# Patient Record
Sex: Female | Born: 1975 | Race: Black or African American | Hispanic: No | Marital: Married | Smoking: Current every day smoker
Health system: Southern US, Community
[De-identification: ages and names within clinical notes are randomized; demographics above are authoritative.]

## PROBLEM LIST (undated history)

## (undated) DIAGNOSIS — N8000 Endometriosis of the uterus, unspecified: Secondary | ICD-10-CM

## (undated) DIAGNOSIS — G609 Hereditary and idiopathic neuropathy, unspecified: Secondary | ICD-10-CM

## (undated) DIAGNOSIS — F32A Depression, unspecified: Secondary | ICD-10-CM

## (undated) DIAGNOSIS — R519 Headache, unspecified: Secondary | ICD-10-CM

## (undated) DIAGNOSIS — F419 Anxiety disorder, unspecified: Secondary | ICD-10-CM

## (undated) DIAGNOSIS — H5789 Other specified disorders of eye and adnexa: Secondary | ICD-10-CM

## (undated) DIAGNOSIS — M1990 Unspecified osteoarthritis, unspecified site: Secondary | ICD-10-CM

## (undated) DIAGNOSIS — D573 Sickle-cell trait: Secondary | ICD-10-CM

## (undated) DIAGNOSIS — R51 Headache: Secondary | ICD-10-CM

## (undated) DIAGNOSIS — F329 Major depressive disorder, single episode, unspecified: Secondary | ICD-10-CM

## (undated) DIAGNOSIS — M792 Neuralgia and neuritis, unspecified: Secondary | ICD-10-CM

## (undated) DIAGNOSIS — D649 Anemia, unspecified: Secondary | ICD-10-CM

## (undated) DIAGNOSIS — I10 Essential (primary) hypertension: Secondary | ICD-10-CM

## (undated) HISTORY — DX: Headache: R51

## (undated) HISTORY — DX: Other specified disorders of eye and adnexa: H57.89

## (undated) HISTORY — DX: Anxiety disorder, unspecified: F41.9

## (undated) HISTORY — DX: Neuralgia and neuritis, unspecified: M79.2

## (undated) HISTORY — DX: Endometriosis of the uterus, unspecified: N80.00

## (undated) HISTORY — DX: Sickle-cell trait: D57.3

## (undated) HISTORY — DX: Depression, unspecified: F32.A

## (undated) HISTORY — DX: Endometriosis of uterus: N80.0

## (undated) HISTORY — DX: Morbid (severe) obesity due to excess calories: E66.01

## (undated) HISTORY — DX: Headache, unspecified: R51.9

## (undated) HISTORY — DX: Unspecified osteoarthritis, unspecified site: M19.90

## (undated) HISTORY — DX: Essential (primary) hypertension: I10

## (undated) HISTORY — DX: Anemia, unspecified: D64.9

## (undated) HISTORY — PX: OTHER SURGICAL HISTORY: SHX169

---
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
## 2014-05-10 ENCOUNTER — Other Ambulatory Visit: Payer: Self-pay | Admitting: Family Medicine

## 2014-05-10 DIAGNOSIS — Z975 Presence of (intrauterine) contraceptive device: Secondary | ICD-10-CM

## 2014-05-11 ENCOUNTER — Other Ambulatory Visit: Payer: Self-pay

## 2014-05-12 ENCOUNTER — Ambulatory Visit
Admission: RE | Admit: 2014-05-12 | Discharge: 2014-05-12 | Disposition: A | Discharge status: Home / Self Care | Payer: BC Managed Care – PPO | Source: Ambulatory Visit | Attending: Family Medicine | Admitting: Family Medicine

## 2014-05-12 ENCOUNTER — Encounter (INDEPENDENT_AMBULATORY_CARE_PROVIDER_SITE_OTHER): Payer: Self-pay

## 2014-05-12 DIAGNOSIS — Z975 Presence of (intrauterine) contraceptive device: Secondary | ICD-10-CM

## 2014-05-19 ENCOUNTER — Encounter (INDEPENDENT_AMBULATORY_CARE_PROVIDER_SITE_OTHER): Payer: BC Managed Care – PPO | Admitting: Ophthalmology

## 2014-05-19 DIAGNOSIS — H43819 Vitreous degeneration, unspecified eye: Secondary | ICD-10-CM

## 2014-05-19 DIAGNOSIS — H35349 Macular cyst, hole, or pseudohole, unspecified eye: Secondary | ICD-10-CM

## 2014-05-19 DIAGNOSIS — I10 Essential (primary) hypertension: Secondary | ICD-10-CM

## 2014-05-19 DIAGNOSIS — H2510 Age-related nuclear cataract, unspecified eye: Secondary | ICD-10-CM

## 2014-05-19 DIAGNOSIS — H35039 Hypertensive retinopathy, unspecified eye: Secondary | ICD-10-CM

## 2014-06-07 ENCOUNTER — Encounter: Payer: Self-pay | Admitting: Obstetrics & Gynecology

## 2014-06-07 ENCOUNTER — Ambulatory Visit (INDEPENDENT_AMBULATORY_CARE_PROVIDER_SITE_OTHER): Payer: BC Managed Care – PPO | Admitting: Obstetrics & Gynecology

## 2014-06-07 ENCOUNTER — Ambulatory Visit: Payer: Self-pay | Admitting: Obstetrics & Gynecology

## 2014-06-07 VITALS — BP 108/65 | HR 65 | Temp 98.2°F | Ht 63.5 in | Wt 230.0 lb

## 2014-06-07 DIAGNOSIS — Z3202 Encounter for pregnancy test, result negative: Secondary | ICD-10-CM

## 2014-06-07 DIAGNOSIS — T8332XA Displacement of intrauterine contraceptive device, initial encounter: Secondary | ICD-10-CM | POA: Insufficient documentation

## 2014-06-07 DIAGNOSIS — R102 Pelvic and perineal pain: Secondary | ICD-10-CM

## 2014-06-07 DIAGNOSIS — R109 Unspecified abdominal pain: Secondary | ICD-10-CM

## 2014-06-07 DIAGNOSIS — G8929 Other chronic pain: Secondary | ICD-10-CM

## 2014-06-07 DIAGNOSIS — N949 Unspecified condition associated with female genital organs and menstrual cycle: Secondary | ICD-10-CM

## 2014-06-07 MED ORDER — MEFENAMIC ACID 250 MG PO CAPS: 250.0000 | ORAL_CAPSULE | Freq: Three times a day (TID) | ORAL | Status: DC

## 2014-06-07 NOTE — Progress Notes (Signed)
Patient ID: Sheri Roberts, female   DOB: 06-12-1976, 38 y.o.   MRN: [id number]  Chief Complaint  Patient presents with  . Problem    Worsening cramping    HPI Sheri Roberts is a 38 y.o. female.  H/O dysmenorrhea and AUB--?A.  Mirena placed several years ago for medical management.  She reports a good response to the heavy menses however there has been a gradual worsening the pelvic cramping.  She does not take any OTC analgesics.  Recent 2-D US imaging showed normal adnexa and the IUD in an appropriate position. There were also sonographic features c/w possible adenomyosis. HPI  Past Medical History  Diagnosis Date  . Cyst of eye   . Hypertension     History reviewed. No pertinent past surgical history.  Family History  Problem Relation Age of Onset  . Stroke Mother   . Diabetes Other   . Glaucoma Other   . Hypertension Other     Social History History  Substance Use Topics  . Smoking status: Former Research scientist (life sciences)  . Smokeless tobacco: Never Used  . Alcohol Use: No    Allergies  Allergen Reactions  . Bee Venom Swelling  . Penicillins Hives  . Sulfa Antibiotics Hives    Current Outpatient Prescriptions  Medication Sig Dispense Refill  . amitriptyline (ELAVIL) 25 MG tablet Take 25 mg by mouth at bedtime.      . bisoprolol-hydrochlorothiazide (ZIAC) 5-6.25 MG per tablet Take 1 tablet by mouth daily.      . DULoxetine (CYMBALTA) 30 MG capsule Take 30 mg by mouth daily.      Marland Kitchen EPINEPHrine (EPIPEN) 0.3 mg/0.3 mL IJ SOAJ injection Inject into the muscle once.      . Eszopiclone 3 MG TABS Take 3 mg by mouth at bedtime. Take immediately before bedtime      . fluticasone (FLONASE) 50 MCG/ACT nasal spray Place into both nostrils daily.      . furosemide (LASIX) 20 MG tablet Take 20 mg by mouth.      . gabapentin (NEURONTIN) 300 MG capsule Take 300 mg by mouth 3 (three) times daily.      Marland Kitchen imipramine (TOFRANIL) 50 MG tablet Take 50 mg by mouth at bedtime.      . fluconazole  (DIFLUCAN) 150 MG tablet Take 150 mg by mouth daily.      Marland Kitchen FLUoxetine (PROZAC) 10 MG capsule Take 10 mg by mouth daily.      . Mefenamic Acid 250 MG CAPS Take 250 capsules (62,500 mg total) by mouth 3 (three) times daily.  21 each  2   No current facility-administered medications for this visit.    Review of Systems Review of Systems Constitutional: negative for fatigue and weight loss Respiratory: negative for cough and wheezing Cardiovascular: negative for chest pain, fatigue and palpitations Gastrointestinal: negative for abdominal pain and change in bowel habits Genitourinary: positive for pelvic cramping, brown discharge Integument/breast: negative for nipple discharge Musculoskeletal:negative for myalgias Neurological: negative for gait problems and tremors Behavioral/Psych: negative for abusive relationship, depression Endocrine: negative for temperature intolerance     Blood pressure 108/65, pulse 65, temperature 98.2 F (36.8 C), height 5' 3.5" (1.613 m), weight 104.327 kg (230 lb).  Physical Exam Physical Exam General:   alert  Skin:   no rash or abnormalities  Lungs:   clear to auscultation bilaterally  Heart:   regular rate and rhythm, S1, S2 normal, no murmur, click, rub or gallop  Breasts:   normal without suspicious  masses, skin or nipple changes or axillary nodes  Abdomen:  normal findings: no organomegaly, soft, non-tender and no hernia  Pelvis:  External genitalia: normal general appearance Urinary system: urethral meatus normal and bladder without fullness, nontender Vaginal: normal without tenderness, induration or masses Cervix: normal appearance Adnexa: normal bimanual exam Uterus: anteverted and non-tender, normal size    3-D U/S imaging of the uterus was performed--no myometrial penetration of the IUD  Data Reviewed U/S, pelvic UPT Assessment    Pelvic pain--likely adenomyosis; doubt malpositioned IUD    Plan     Trial of Ponstel Need to  obtain previous records Possible management options include: removal of IUD-->GnRH agonist, aromatase inhibitor, continuous COCP, endometrial ablation-?opitmal candidate secondary to obesity, [place], hysterectomy Follow up as needed.        Roberts,Sheri Laughman A 06/07/2014, 5:57 PM

## 2014-06-08 LAB — POCT URINE PREGNANCY: PREG TEST UR: NEGATIVE

## 2014-06-08 NOTE — Addendum Note (Signed)
Addended by: Ladona Ridgel on: 06/08/2014 11:02 AM   Modules accepted: Orders

## 2014-06-24 ENCOUNTER — Telehealth: Payer: Self-pay | Admitting: *Deleted

## 2014-06-24 NOTE — Telephone Encounter (Signed)
Pt placed call to office asking for return call. Return call made to pt.  Pt states that she is concerned about IUD and what other birth control to use.  Pt states that she has history of blood clots and has high BP.  Pt states that this makes her concerned about taking pills.  Pt made aware that her history and medication would be reviewed before any medication was given to know what would be best for her.  Pt states that she knows that she doesn't want any more children and mentioned all other options that were discussed at last visit- patch, pill, hysterctomy.  Pt would like to know what is best plan for her. Pt advised her concerns would be sent to Dr Delsa Sale for further review and ask for her advisement.  Pt made aware that she would be contacted once recommendations have been made.  Pt states understanding.  Can you please advise of best plan for pt regarding birth control?

## 2014-06-29 NOTE — Telephone Encounter (Signed)
IUD is safe with her history.  If she has any other concerns offer an appointment to discuss.

## 2014-06-30 NOTE — Telephone Encounter (Signed)
Left message on pt cell number to contact the office regarding her message from last week.

## 2014-06-30 NOTE — Telephone Encounter (Signed)
Pt needs to be advised of Dr Delsa Sale recommendation

## 2014-07-02 NOTE — Telephone Encounter (Signed)
Pt states that she currently has IUD in place.  Pt was seen at the end on June for increase pelvic cramping.  Pt states that the cramping is no better, she will get sharp pains in her pelvic area.  Pt states that she was given pain med at last visit, Mefenamic Acid, pt is currently trying to get refill.  Pt made aware that this will be sent to Delsa Sale for further review.  Pt made aware that she may need f/u appt for pelvic pain.   Please advise.

## 2014-07-04 NOTE — Telephone Encounter (Signed)
Offer appointment; consider possible removal of IUD

## 2014-07-06 NOTE — Telephone Encounter (Signed)
Attempt to call pt. No answer, LM on VM to contact office.

## 2014-07-21 NOTE — Telephone Encounter (Signed)
LM on VM to CB- next choice would be to remove the IUD. Please call if her symptoms are no better and that is the option she would like .

## 2014-08-04 DIAGNOSIS — I10 Essential (primary) hypertension: Secondary | ICD-10-CM | POA: Insufficient documentation

## 2014-08-04 DIAGNOSIS — D573 Sickle-cell trait: Secondary | ICD-10-CM | POA: Insufficient documentation

## 2014-08-04 DIAGNOSIS — N8003 Adenomyosis of the uterus: Secondary | ICD-10-CM | POA: Insufficient documentation

## 2014-08-04 DIAGNOSIS — N800 Endometriosis of uterus: Secondary | ICD-10-CM | POA: Insufficient documentation

## 2014-08-04 DIAGNOSIS — N809 Endometriosis, unspecified: Secondary | ICD-10-CM

## 2014-08-10 DIAGNOSIS — M792 Neuralgia and neuritis, unspecified: Secondary | ICD-10-CM | POA: Insufficient documentation

## 2014-09-21 DIAGNOSIS — J309 Allergic rhinitis, unspecified: Secondary | ICD-10-CM | POA: Insufficient documentation

## 2014-10-11 ENCOUNTER — Encounter: Payer: Self-pay | Admitting: Obstetrics & Gynecology

## 2014-10-21 ENCOUNTER — Ambulatory Visit: Payer: BC Managed Care – PPO | Admitting: Obstetrics & Gynecology

## 2014-10-26 ENCOUNTER — Ambulatory Visit: Payer: BC Managed Care – PPO | Admitting: Obstetrics

## 2014-11-24 ENCOUNTER — Encounter: Payer: Self-pay | Admitting: Obstetrics & Gynecology

## 2014-11-24 ENCOUNTER — Ambulatory Visit (INDEPENDENT_AMBULATORY_CARE_PROVIDER_SITE_OTHER): Payer: BC Managed Care – PPO | Admitting: Obstetrics & Gynecology

## 2014-11-24 VITALS — BP 104/68 | HR 64 | Temp 98.1°F | Ht 63.0 in | Wt 228.0 lb

## 2014-11-24 DIAGNOSIS — G8929 Other chronic pain: Secondary | ICD-10-CM

## 2014-11-24 DIAGNOSIS — R102 Pelvic and perineal pain: Principal | ICD-10-CM

## 2014-11-24 DIAGNOSIS — N949 Unspecified condition associated with female genital organs and menstrual cycle: Secondary | ICD-10-CM

## 2014-11-28 NOTE — Progress Notes (Signed)
Patient ID: Sheri Roberts, female   DOB: 1976/05/10, 38 y.o.   MRN: [id number]  Chief Complaint  Patient presents with  . Follow-up    lower abdominal pain     HPI Sheri Roberts is a 38 y.o. female.  She characterizes the pain as mild. Amenorrhea.  HPI  Past Medical History  Diagnosis Date  . Cyst of eye   . Hypertension     History reviewed. No pertinent past surgical history.  Family History  Problem Relation Age of Onset  . Stroke Mother   . Diabetes Other   . Glaucoma Other   . Hypertension Other     Social History History  Substance Use Topics  . Smoking status: Former Research scientist (life sciences)  . Smokeless tobacco: Never Used  . Alcohol Use: No    Allergies  Allergen Reactions  . Bee Venom Swelling  . Penicillins Hives  . Sulfa Antibiotics Hives    Current Outpatient Prescriptions  Medication Sig Dispense Refill  . Armodafinil (NUVIGIL) 250 MG tablet Take 250 mg by mouth daily.    . bisoprolol-hydrochlorothiazide (ZIAC) 5-6.25 MG per tablet Take 1 tablet by mouth daily.    . Cholecalciferol (VITAMIN D PO) Take by mouth.    . EPINEPHrine (EPIPEN) 0.3 mg/0.3 mL IJ SOAJ injection Inject into the muscle once.    . fluticasone (FLONASE) 50 MCG/ACT nasal spray Place into both nostrils daily.    . furosemide (LASIX) 20 MG tablet Take 20 mg by mouth.    . gabapentin (NEURONTIN) 300 MG capsule Take 300 mg by mouth 3 (three) times daily.    Marland Kitchen Gentamicin-Prednisolone Acet (PRED-G OP) Apply to eye.    . hydrOXYzine (ATARAX/VISTARIL) 25 MG tablet Take 25 mg by mouth 3 (three) times daily as needed.    . IRON PO Take by mouth.    . MELOXICAM PO Take 10 mg by mouth.    . OMEPRAZOLE PO Take 20 mg by mouth.    Marland Kitchen POTASSIUM PO Take by mouth.    Marland Kitchen amitriptyline (ELAVIL) 25 MG tablet Take 25 mg by mouth at bedtime.    . DULoxetine (CYMBALTA) 30 MG capsule Take 30 mg by mouth daily.    . Eszopiclone 3 MG TABS Take 3 mg by mouth at bedtime. Take immediately before bedtime    .  fluconazole (DIFLUCAN) 150 MG tablet Take 150 mg by mouth daily.    Marland Kitchen FLUoxetine (PROZAC) 10 MG capsule Take 10 mg by mouth daily.    Marland Kitchen HYDROCODONE-ACETAMINOPHEN PO Take by mouth.    Marland Kitchen imipramine (TOFRANIL) 50 MG tablet Take 50 mg by mouth at bedtime.    . Mefenamic Acid 250 MG CAPS Take 250 capsules (62,500 mg total) by mouth 3 (three) times daily. (Patient not taking: Reported on 11/24/2014) 21 each 2   No current facility-administered medications for this visit.    Review of Systems Review of Systems Constitutional: negative for fatigue and weight loss Respiratory: negative for cough and wheezing Cardiovascular: negative for chest pain, fatigue and palpitations Gastrointestinal: negative for abdominal pain and change in bowel habits Genitourinary:positive for pelvic pain Integument/breast: negative for nipple discharge Musculoskeletal:negative for myalgias Neurological: negative for gait problems and tremors Behavioral/Psych: negative for abusive relationship, depression Endocrine: negative for temperature intolerance     Blood pressure 104/68, pulse 64, temperature 98.1 F (36.7 C), height 5\' 3"  (1.6 m), weight 103.42 kg (228 lb), last menstrual period 10/25/2014.  Physical Exam Physical Exam   50% of 15 min visit spent on  counseling and coordination of care.   Data Reviewed None  Assessment    Possible adenomyosis--Mirena IUD insitu Counseled re: duration of Mirena use--6 - 7 yrs     Plan    Follow up as needed or in 6 mths         JACKSON-MOORE,Raymir Frommelt A 11/28/2014, 12:34 PM

## 2014-12-06 ENCOUNTER — Encounter: Payer: Self-pay | Admitting: *Deleted

## 2014-12-07 ENCOUNTER — Encounter: Payer: Self-pay | Admitting: Obstetrics & Gynecology

## 2015-03-17 ENCOUNTER — Telehealth: Payer: Self-pay

## 2015-03-17 ENCOUNTER — Other Ambulatory Visit: Payer: Self-pay | Admitting: *Deleted

## 2015-03-17 DIAGNOSIS — N939 Abnormal uterine and vaginal bleeding, unspecified: Secondary | ICD-10-CM

## 2015-03-17 NOTE — Telephone Encounter (Signed)
Called patient regarding appt for u/s for iud removal and new patient appt sch for 03/30/15 starting at 10am - spoke with patient

## 2015-03-25 ENCOUNTER — Telehealth: Payer: Self-pay | Admitting: Obstetrics

## 2015-03-26 ENCOUNTER — Emergency Department (HOSPITAL_COMMUNITY)
Admission: EM | Admit: 2015-03-26 | Discharge: 2015-03-26 | Disposition: A | Discharge status: Home / Self Care | Payer: BLUE CROSS/BLUE SHIELD | Attending: Emergency Medicine | Admitting: Emergency Medicine

## 2015-03-26 ENCOUNTER — Emergency Department (HOSPITAL_COMMUNITY): Payer: BLUE CROSS/BLUE SHIELD

## 2015-03-26 ENCOUNTER — Encounter (HOSPITAL_COMMUNITY): Payer: Self-pay | Admitting: Emergency Medicine

## 2015-03-26 DIAGNOSIS — Z7952 Long term (current) use of systemic steroids: Secondary | ICD-10-CM | POA: Diagnosis not present

## 2015-03-26 DIAGNOSIS — W1839XA Other fall on same level, initial encounter: Secondary | ICD-10-CM | POA: Diagnosis not present

## 2015-03-26 DIAGNOSIS — Z79899 Other long term (current) drug therapy: Secondary | ICD-10-CM | POA: Diagnosis not present

## 2015-03-26 DIAGNOSIS — Z880 Allergy status to penicillin: Secondary | ICD-10-CM | POA: Insufficient documentation

## 2015-03-26 DIAGNOSIS — S82899A Other fracture of unspecified lower leg, initial encounter for closed fracture: Secondary | ICD-10-CM

## 2015-03-26 DIAGNOSIS — I10 Essential (primary) hypertension: Secondary | ICD-10-CM | POA: Insufficient documentation

## 2015-03-26 DIAGNOSIS — Y92009 Unspecified place in unspecified non-institutional (private) residence as the place of occurrence of the external cause: Secondary | ICD-10-CM | POA: Diagnosis not present

## 2015-03-26 DIAGNOSIS — Y9389 Activity, other specified: Secondary | ICD-10-CM | POA: Diagnosis not present

## 2015-03-26 DIAGNOSIS — Y998 Other external cause status: Secondary | ICD-10-CM | POA: Insufficient documentation

## 2015-03-26 DIAGNOSIS — S99911A Unspecified injury of right ankle, initial encounter: Secondary | ICD-10-CM | POA: Diagnosis present

## 2015-03-26 DIAGNOSIS — Z87891 Personal history of nicotine dependence: Secondary | ICD-10-CM | POA: Diagnosis not present

## 2015-03-26 DIAGNOSIS — S82891A Other fracture of right lower leg, initial encounter for closed fracture: Secondary | ICD-10-CM

## 2015-03-26 DIAGNOSIS — Z8669 Personal history of other diseases of the nervous system and sense organs: Secondary | ICD-10-CM | POA: Diagnosis not present

## 2015-03-26 DIAGNOSIS — S82831A Other fracture of upper and lower end of right fibula, initial encounter for closed fracture: Secondary | ICD-10-CM | POA: Diagnosis not present

## 2015-03-26 HISTORY — DX: Hereditary and idiopathic neuropathy, unspecified: G60.9

## 2015-03-26 HISTORY — PX: ANKLE SURGERY: SHX546

## 2015-03-26 MED ORDER — MORPHINE SULFATE 4 MG/ML IJ SOLN
6.0000 mg | Freq: Once | INTRAMUSCULAR | Status: AC
  Administered 2015-03-26: 6 mg via INTRAVENOUS
  Filled 2015-03-26: qty 2

## 2015-03-26 MED ORDER — OXYCODONE-ACETAMINOPHEN 5-325 MG PO TABS: 1.0000 | ORAL_TABLET | Freq: Four times a day (QID) | ORAL | Status: DC | PRN

## 2015-03-26 MED ORDER — ETOMIDATE 2 MG/ML IV SOLN
0.1500 mg/kg | Freq: Once | INTRAVENOUS | Status: AC
  Administered 2015-03-26: 15.52 mg via INTRAVENOUS
  Filled 2015-03-26: qty 10

## 2015-03-26 MED ORDER — OXYCODONE-ACETAMINOPHEN 5-325 MG PO TABS
1.0000 | ORAL_TABLET | Freq: Once | ORAL | Status: AC
  Administered 2015-03-26: 1 via ORAL
  Filled 2015-03-26: qty 1

## 2015-03-26 NOTE — Sedation Documentation (Signed)
Medication dose calculated and verified for Etomidate

## 2015-03-26 NOTE — Sedation Documentation (Signed)
MD at bedside. 

## 2015-03-26 NOTE — ED Notes (Signed)
Notified xray of order placed

## 2015-03-26 NOTE — ED Notes (Signed)
EDP at bedside  

## 2015-03-26 NOTE — ED Provider Notes (Signed)
CSN: [id number]     Arrival date & time 03/26/15  0820 History   First MD Initiated Contact with Patient 03/26/15 [phone]     Chief Complaint  Patient presents with  . Ankle Injury    closed     (Consider location/radiation/quality/duration/timing/severity/associated sxs/prior Treatment) HPI Comments: Patient with history of idiopathic peripheral neuropathy presents with complaint of ankle injury, deformity. Patient states that she was walking in her home and her right leg gave out. She fell and twisted her ankle. EMS was called due to obvious deformity. Patient complains of pain in the ankle only. She denies knee injury or hip injury. She has a history of falls. Patient last ate last night. Patient was given 10 mg of morphine IV in route. She had palpable pulses per EMS. Placed in splint PTA. The onset of this condition was acute. The course is constant. Aggravating factors: movement. Alleviating factors: none.    Patient is a 39 y.o. female presenting with lower extremity injury. The history is provided by the patient.  Ankle Injury Associated symptoms include arthralgias and joint swelling. Pertinent negatives include no abdominal pain, chest pain, coughing, fever, headaches, myalgias, nausea, rash, sore throat or vomiting.    Past Medical History  Diagnosis Date  . Cyst of eye   . Hypertension    No past surgical history on file. Family History  Problem Relation Age of Onset  . Stroke Mother   . Diabetes Other   . Glaucoma Other   . Hypertension Other    History  Substance Use Topics  . Smoking status: Former Research scientist (life sciences)  . Smokeless tobacco: Never Used  . Alcohol Use: No   OB History    Gravida Para Term Preterm AB TAB SAB Ectopic Multiple Living   4 4 4       4      Review of Systems  Constitutional: Negative for fever.  HENT: Negative for rhinorrhea and sore throat.   Eyes: Negative for redness.  Respiratory: Negative for cough.   Cardiovascular: Negative for chest pain.   Gastrointestinal: Negative for nausea, vomiting, abdominal pain and diarrhea.  Genitourinary: Negative for dysuria.  Musculoskeletal: Positive for joint swelling and arthralgias. Negative for myalgias.  Skin: Negative for rash.  Neurological: Negative for headaches.    Allergies  Bee venom; Penicillins; and Sulfa antibiotics  Home Medications   Prior to Admission medications   Medication Sig Start Date End Date Taking? Authorizing Provider  amitriptyline (ELAVIL) 25 MG tablet Take 25 mg by mouth at bedtime.    Historical Provider, MD  Armodafinil (NUVIGIL) 250 MG tablet Take 250 mg by mouth daily.    Historical Provider, MD  bisoprolol-hydrochlorothiazide [facility]) 5-6.25 MG per tablet Take 1 tablet by mouth daily.    Historical Provider, MD  Cholecalciferol (VITAMIN D PO) Take by mouth.    Historical Provider, MD  DULoxetine (CYMBALTA) 30 MG capsule Take 30 mg by mouth daily.    Historical Provider, MD  EPINEPHrine (EPIPEN) 0.3 mg/0.3 mL IJ SOAJ injection Inject into the muscle once.    Historical Provider, MD  Eszopiclone 3 MG TABS Take 3 mg by mouth at bedtime. Take immediately before bedtime    Historical Provider, MD  fluconazole (DIFLUCAN) 150 MG tablet Take 150 mg by mouth daily.    Historical Provider, MD  FLUoxetine (PROZAC) 10 MG capsule Take 10 mg by mouth daily.    Historical Provider, MD  fluticasone (FLONASE) 50 MCG/ACT nasal spray Place into both nostrils daily.  Historical Provider, MD  furosemide (LASIX) 20 MG tablet Take 20 mg by mouth.    Historical Provider, MD  gabapentin (NEURONTIN) 300 MG capsule Take 300 mg by mouth 3 (three) times daily.    Historical Provider, MD  Gentamicin-Prednisolone Acet (PRED-G OP) Apply to eye.    Historical Provider, MD  HYDROCODONE-ACETAMINOPHEN PO Take by mouth.    Historical Provider, MD  hydrOXYzine (ATARAX/VISTARIL) 25 MG tablet Take 25 mg by mouth 3 (three) times daily as needed.    Historical Provider, MD  imipramine (TOFRANIL)  50 MG tablet Take 50 mg by mouth at bedtime.    Historical Provider, MD  IRON PO Take by mouth.    Historical Provider, MD  Mefenamic Acid 250 MG CAPS Take 250 capsules (62,500 mg total) by mouth 3 (three) times daily. Patient not taking: Reported on 11/24/2014 06/07/14   Lahoma Crocker, MD  MELOXICAM PO Take 10 mg by mouth.    Historical Provider, MD  OMEPRAZOLE PO Take 20 mg by mouth.    Historical Provider, MD  POTASSIUM PO Take by mouth.    Historical Provider, MD   BP 118/59 mmHg  Pulse 64  Temp(Src) 97.8 F (36.6 C) (Oral)  Resp 18  Ht 5\' 3"  (1.6 m)  Wt 228 lb (103.42 kg)  BMI 40.40 kg/m2  SpO2 100%   Physical Exam  Constitutional: She appears well-developed and well-nourished.  HENT:  Head: Normocephalic and atraumatic.  Eyes: Conjunctivae are normal. Right eye exhibits no discharge. Left eye exhibits no discharge.  Neck: Normal range of motion. Neck supple.  Cardiovascular: Normal rate, regular rhythm and normal heart sounds.   Pulses:      Dorsalis pedis pulses are 1+ on the right side.  Faint DP pulse palpable, confirmed with doppler.   Pulmonary/Chest: Effort normal and breath sounds normal.  Abdominal: Soft. There is no tenderness.  Musculoskeletal: She exhibits edema and tenderness.  Right foot is laterally displaced. There is tenting of skin but no disruption of skin.   Neurological: She is alert.  Skin: Skin is warm and dry.  Psychiatric: She has a normal mood and affect.  Nursing note and vitals reviewed.   ED Course  Reduction of fracture Date/Time: 03/26/2015 9:00 AM Performed by: Carlisle Cater Authorized by: Pattricia Boss Consent: Verbal consent obtained. Risks and benefits: risks, benefits and alternatives were discussed Consent given by: patient Patient understanding: patient states understanding of the procedure being performed Patient consent: the patient's understanding of the procedure matches consent given Imaging studies: imaging studies  available Required items: required blood products, implants, devices, and special equipment available Patient identity confirmed: verbally with patient, arm band, provided demographic data and hospital-assigned identification number Time out: Immediately prior to procedure a "time out" was called to verify the correct patient, procedure, equipment, support staff and site/side marked as required. Patient sedated: yes Vitals: Vital signs were monitored during sedation. Patient tolerance: Patient tolerated the procedure well with no immediate complications Comments: Pedal pulse palpated after procedure. Procedure performed by myself and Dr. Jeanell Sparrow.    (including critical care time) Labs Review Labs Reviewed - No data to display  Imaging Review Dg Ankle Right Port  03/26/2015   CLINICAL DATA:  Followup close reduction of the ankle fracture dislocation.  EXAM: PORTABLE RIGHT ANKLE - 2 VIEW  COMPARISON:  Pre reduction images obtained this same date.  FINDINGS: The dislocated talus has been reduced into near anatomic alignment. There is a coronal oblique fracture of the distal fibula, which  is now well aligned.  The distal fibular fracture was felt to represent a tibial fracture on the initial radiograph. There is a small sliver of bone along the posterior margin of the distal tibia consistent with a small tibial avulsion fracture. No convincing medial malleolar fracture.  Mild lateral talar subluxation is evident measuring approximately 3 mm.  IMPRESSION: 1. Only minor lateral talar subluxation persists after close reduction of the ankle fracture and talar dislocation. 2. There is only a minimal posterior avulsion fracture from the distal tibia. 3. Coronal oblique fracture of the distal fibula is in near anatomic alignment. No other fractures.   Electronically Signed   By: Lajean Manes M.D.   On: 03/26/2015 09:38   Dg Ankle Right Port  03/26/2015   CLINICAL DATA:  Per ED note: Per EMS, pt comes from home  after falling on her right ankle. Pt was getting kid ready for a race and states her right leg gave out and fell. Pt AANDOX4, NAD noted. Pt has a closed right ankle injuy with an abrasion  EXAM: PORTABLE RIGHT ANKLE - 2 VIEW  COMPARISON:  None.  FINDINGS: There is a fracture displacement of the right ankle. There is an oblique fracture of the distal fibula. There is a fracture of the distal tibia wanted along the coronal plane. The talus has displaced posteriorly in relation to the tibia and fibula. The medial malleolar fracture suspected but not directly seen along the images provided.  No other fractures.  IMPRESSION: Fractures of the distal fibula and tibia with displacement of the talus posteriorly in relation to the tibia and fibula.   Electronically Signed   By: Lajean Manes M.D.   On: 03/26/2015 09:04     EKG Interpretation None       8:30 AM Patient seen and examined. Strong DP pulse with doppler. Foot appears well perfused. She has decreased sensation at baseline due to idiopathic peripheral neuropathy. Work-up initiated. Medications ordered.   Vital signs reviewed and are as follows: BP 138/105 mmHg  Pulse 62  Temp(Src) 97.8 F (36.6 C) (Oral)  Resp 16  Ht 5\' 3"  (1.6 m)  Wt 228 lb (103.42 kg)  BMI 40.40 kg/m2  SpO2 99%  9:13 AM Sedation performed by Dr. Jeanell Sparrow. Reduction of dislocation performed. Post-reduction film ordered.   9:52 AM Spoke with Dr. Ninfa Linden and reviewed post-reduction image results. He advises patient to be non-weight bearing, f/u in office early next week.   Pt informed and agrees with plan. Will get her up on crutches. PO percocet ordered. Visible toes are warm and appear well-perfused. Will d/c to home.   Patient counseled on use of narcotic pain medications. Counseled not to combine these medications with others containing tylenol. Urged not to drink alcohol, drive, or perform any other activities that requires focus while taking these medications. The patient  verbalizes understanding and agrees with the plan.   MDM   Final diagnoses:  Ankle fracture  Closed fracture of right ankle, initial encounter    Ankle fx as above. Ortho follow-up arranged. Extremity is vascularly intact.    Carlisle Cater, PA-C 03/26/15 [place], MD 03/27/15 [phone]

## 2015-03-26 NOTE — Discharge Instructions (Signed)
Please read and follow all provided instructions.  Your diagnoses today include:  1. Closed fracture of right ankle, initial encounter   2. Ankle fracture     Tests performed today include:  An x-ray of the affected area - shows ankle broken in 2 places  Vital signs. See below for your results today.   Medications prescribed:   Percocet (oxycodone/acetaminophen) - narcotic pain medication  DO NOT drive or perform any activities that require you to be awake and alert because this medicine can make you drowsy. BE VERY CAREFUL not to take multiple medicines containing Tylenol (also called acetaminophen). Doing so can lead to an overdose which can damage your liver and cause liver failure and possibly death.  Take any prescribed medications only as directed.  Home care instructions:   Follow any educational materials contained in this packet  Use crutches at all time and do not bear weight on your right leg at all  Follow R.I.C.E. Protocol:  R - rest your injury   I  - use ice on injury without applying directly to skin  C - compress injury with bandage or splint  E - elevate the injury as much as possible  Follow-up instructions: Please follow-up with the provided orthopedic physician (bone specialist). Call their office on Monday morning and schedule an appointment to be seen.   Return instructions:   Please return if your toes or feet are numb or tingling, appear gray or blue, or you have severe pain (also elevate the leg and loosen splint or wrap if you were given one)  Please return to the Emergency Department if you experience worsening symptoms.   Please return if you have any other emergent concerns.  Additional Information:  Your vital signs today were: BP 116/59 mmHg   Pulse 62   Temp(Src) 97.8 F (36.6 C) (Oral)   Resp 14   Ht 5\' 3"  (1.6 m)   Wt 228 lb (103.42 kg)   BMI 40.40 kg/m2   SpO2 97% If your blood pressure (BP) was elevated above 135/85 this visit,  please have this repeated by your doctor within one month. --------------

## 2015-03-26 NOTE — Progress Notes (Signed)
Orthopedic Tech Progress Note Patient Details:  Sheri Roberts [birth year] [id number]  Ortho Devices Type of Ortho Device: Ace wrap, Post (short leg) splint, Stirrup splint Ortho Device/Splint Location: rle Ortho Device/Splint Interventions: Application Ankle reduction  [name] 03/26/2015, 9:11 AM

## 2015-03-26 NOTE — ED Notes (Signed)
Per EMS, pt comes from home after falling on her right ankle. Pt was getting kid ready for a race and states her right leg gave out and fell. Pt A&OX4, NAD noted. Pt has a closed right ankle injuy with an abrasion noted on skin. Pt has h/o idiopathic neuropathy. IV started, 10mg  morphine given en route. VSS.

## 2015-03-26 NOTE — Sedation Documentation (Signed)
Vital signs stable. 

## 2015-03-26 NOTE — Progress Notes (Signed)
Orthopedic Tech Progress Note Patient Details:  Sheri Roberts 1976-11-07 [id number]  Ortho Devices Type of Ortho Device: Crutches Ortho Device/Splint Location: rle Ortho Device/Splint Interventions: Application   France Lusty 03/26/2015, 11:12 AM

## 2015-03-27 ENCOUNTER — Emergency Department (HOSPITAL_COMMUNITY)
Admission: EM | Admit: 2015-03-27 | Discharge: 2015-03-27 | Disposition: A | Discharge status: Home / Self Care | Payer: BLUE CROSS/BLUE SHIELD | Attending: Emergency Medicine | Admitting: Emergency Medicine

## 2015-03-27 ENCOUNTER — Encounter (HOSPITAL_COMMUNITY): Payer: Self-pay

## 2015-03-27 DIAGNOSIS — G609 Hereditary and idiopathic neuropathy, unspecified: Secondary | ICD-10-CM | POA: Insufficient documentation

## 2015-03-27 DIAGNOSIS — R110 Nausea: Secondary | ICD-10-CM | POA: Diagnosis not present

## 2015-03-27 DIAGNOSIS — Z880 Allergy status to penicillin: Secondary | ICD-10-CM | POA: Diagnosis not present

## 2015-03-27 DIAGNOSIS — R200 Anesthesia of skin: Secondary | ICD-10-CM | POA: Diagnosis present

## 2015-03-27 DIAGNOSIS — Z87891 Personal history of nicotine dependence: Secondary | ICD-10-CM | POA: Insufficient documentation

## 2015-03-27 DIAGNOSIS — Z7951 Long term (current) use of inhaled steroids: Secondary | ICD-10-CM | POA: Insufficient documentation

## 2015-03-27 DIAGNOSIS — M549 Dorsalgia, unspecified: Secondary | ICD-10-CM | POA: Insufficient documentation

## 2015-03-27 DIAGNOSIS — Z79899 Other long term (current) drug therapy: Secondary | ICD-10-CM | POA: Insufficient documentation

## 2015-03-27 DIAGNOSIS — S82841S Displaced bimalleolar fracture of right lower leg, sequela: Secondary | ICD-10-CM | POA: Diagnosis not present

## 2015-03-27 DIAGNOSIS — W1839XS Other fall on same level, sequela: Secondary | ICD-10-CM | POA: Insufficient documentation

## 2015-03-27 DIAGNOSIS — I10 Essential (primary) hypertension: Secondary | ICD-10-CM | POA: Insufficient documentation

## 2015-03-27 NOTE — ED Provider Notes (Signed)
CSN: [id number]     Arrival date & time 03/27/15  2017 History   This chart was scribed for Fredia Sorrow, MD by Randa Evens, ED Scribe. This patient was seen in room APA15/APA15 and the patient's care was started at 8:29 PM.      Chief Complaint  Patient presents with  . Numbness   The history is provided by the patient. No language interpreter was used.   HPI Comments: Sheri Roberts is a 39 y.o. female who presents to the Emergency Department complaining of new numbness in the right ankle today after fracture dislocation yesterday. Pt had the ankle reduced and splint placed. Pt states she has a hx of neuropathy and fell yesterday causing her to fall from a standing height. Pt reports some nausea from the pain medication prescribed. Pt states she is going to schedule her follow up appointment with orthopedics tomorrow.     Past Medical History  Diagnosis Date  . Cyst of eye   . Hypertension   . Idiopathic neuropathy    Past Surgical History  Procedure Laterality Date  . Spinal tap     Family History  Problem Relation Age of Onset  . Stroke Mother   . Diabetes Other   . Glaucoma Other   . Hypertension Other    History  Substance Use Topics  . Smoking status: Former Research scientist (life sciences)  . Smokeless tobacco: Never Used  . Alcohol Use: No   OB History    Gravida Para Term Preterm AB TAB SAB Ectopic Multiple Living   4 4 4       4      Review of Systems  Constitutional: Negative for fever and chills.  HENT: Negative for rhinorrhea and sore throat.   Eyes: Negative for visual disturbance.  Respiratory: Negative for cough and shortness of breath.   Cardiovascular: Negative for chest pain.  Gastrointestinal: Positive for nausea. Negative for vomiting, abdominal pain and diarrhea.  Genitourinary: Negative for dysuria.  Musculoskeletal: Positive for back pain, joint swelling and arthralgias. Negative for neck pain.  Skin: Negative for rash.  Neurological: Negative for headaches.   Psychiatric/Behavioral: Negative for confusion.      Allergies  Bee venom; Penicillins; and Sulfa antibiotics  Home Medications   Prior to Admission medications   Medication Sig Start Date End Date Taking? Authorizing Provider  amitriptyline (ELAVIL) 50 MG tablet Take 50 mg by mouth at bedtime.    Historical Provider, MD  Armodafinil (NUVIGIL) 250 MG tablet Take 250 mg by mouth daily.    Historical Provider, MD  bisoprolol-hydrochlorothiazide [facility]) 5-6.25 MG per tablet Take 1 tablet by mouth daily.    Historical Provider, MD  EPINEPHrine (EPIPEN) 0.3 mg/0.3 mL IJ SOAJ injection Inject into the muscle once.    Historical Provider, MD  FLUoxetine (PROZAC) 10 MG capsule Take 10 mg by mouth daily.    Historical Provider, MD  fluticasone (FLONASE) 50 MCG/ACT nasal spray Place into both nostrils daily.    Historical Provider, MD  furosemide (LASIX) 20 MG tablet Take 20 mg by mouth.    Historical Provider, MD  gabapentin (NEURONTIN) 300 MG capsule Take 300 mg by mouth 3 (three) times daily.    Historical Provider, MD  hydrOXYzine (ATARAX/VISTARIL) 25 MG tablet Take 25 mg by mouth 3 (three) times daily as needed for anxiety.     Historical Provider, MD  IRON PO Take by mouth.    Historical Provider, MD  MELOXICAM PO Take 10 mg by mouth 4 (four) times  daily.     Historical Provider, MD  oxyCODONE-acetaminophen (PERCOCET/ROXICET) 5-325 MG per tablet Take 1-2 tablets by mouth every 6 (six) hours as needed for severe pain. 03/26/15   Carlisle Cater, PA-C  traZODone (DESYREL) 50 MG tablet Take 50 mg by mouth at bedtime.    Historical Provider, MD   BP 109/60 mmHg  Pulse 69  Temp(Src) 98.6 F (37 C) (Oral)  Resp 20  Ht 5\' 3"  (1.6 m)  Wt 228 lb (103.42 kg)  BMI 40.40 kg/m2  SpO2 100%   Physical Exam  Constitutional: She is oriented to person, place, and time. She appears well-developed and well-nourished. No distress.  HENT:  Head: Normocephalic and atraumatic.  Eyes: Conjunctivae and EOM  are normal. Pupils are equal, round, and reactive to light. No scleral icterus.  Neck: Neck supple. No tracheal deviation present.  Cardiovascular: Normal rate, regular rhythm and normal heart sounds.   Pulses:      Dorsalis pedis pulses are 1+ on the left side.  Pulmonary/Chest: Effort normal and breath sounds normal. No respiratory distress.  Abdominal: Soft. Bowel sounds are normal. There is no tenderness.  Musculoskeletal: Normal range of motion.  Cap refill less than 1 sec in right foot.   Neurological: She is alert and oriented to person, place, and time. No cranial nerve deficit. Coordination normal.  Skin: Skin is warm and dry.  Psychiatric: She has a normal mood and affect. Her behavior is normal.  Nursing note and vitals reviewed.   ED Course  Procedures (including critical care time) DIAGNOSTIC STUDIES: Oxygen Saturation is 100% on RA, normal by my interpretation.    COORDINATION OF CARE: 8:38 PM-Discussed treatment plan with pt at bedside and pt agreed to plan.     Labs Review Labs Reviewed - No data to display  Imaging Review Dg Ankle Right Port  03/26/2015   CLINICAL DATA:  Followup close reduction of the ankle fracture dislocation.  EXAM: PORTABLE RIGHT ANKLE - 2 VIEW  COMPARISON:  Pre reduction images obtained this same date.  FINDINGS: The dislocated talus has been reduced into near anatomic alignment. There is a coronal oblique fracture of the distal fibula, which is now well aligned.  The distal fibular fracture was felt to represent a tibial fracture on the initial radiograph. There is a small sliver of bone along the posterior margin of the distal tibia consistent with a small tibial avulsion fracture. No convincing medial malleolar fracture.  Mild lateral talar subluxation is evident measuring approximately 3 mm.  IMPRESSION: 1. Only minor lateral talar subluxation persists after close reduction of the ankle fracture and talar dislocation. 2. There is only a  minimal posterior avulsion fracture from the distal tibia. 3. Coronal oblique fracture of the distal fibula is in near anatomic alignment. No other fractures.   Electronically Signed   By: Lajean Manes M.D.   On: 03/26/2015 09:38   Dg Ankle Right Port  03/26/2015   CLINICAL DATA:  Per ED note: Per EMS, pt comes from home after falling on her right ankle. Pt was getting kid ready for a race and states her right leg gave out and fell. Pt AANDOX4, NAD noted. Pt has a closed right ankle injuy with an abrasion  EXAM: PORTABLE RIGHT ANKLE - 2 VIEW  COMPARISON:  None.  FINDINGS: There is a fracture displacement of the right ankle. There is an oblique fracture of the distal fibula. There is a fracture of the distal tibia wanted along the coronal plane.  The talus has displaced posteriorly in relation to the tibia and fibula. The medial malleolar fracture suspected but not directly seen along the images provided.  No other fractures.  IMPRESSION: Fractures of the distal fibula and tibia with displacement of the talus posteriorly in relation to the tibia and fibula.   Electronically Signed   By: Lajean Manes M.D.   On: 03/26/2015 09:04     EKG Interpretation None      MDM   Final diagnoses:  Ankle fracture, bimalleolar, closed, right, sequela    Patient presents for follow-up following ankle fracture dislocation that was taken care of [facility] yesterday. Patient had a reduction. Placed in a sugar tong posterior type splint. Patient worried about increased numbness. Pain is fairly well-controlled with hydrocodone. Patient is elevating the foot and using crutches. Patient has follow-up with orthopedics tomorrow. Patient's toes have a good cap refill on the right. Does have feeling to the toes. Patient has some baseline neuropathy anyways but does state that the numbness is worse. Appears to be no complicating factors following the fracture splinting and reduction.       I personally performed the  services described in this documentation, which was scribed in my presence. The recorded information has been reviewed and is accurate.       Fredia Sorrow, MD 03/27/15 [phone]

## 2015-03-27 NOTE — ED Notes (Signed)
Right ankle fracture that I was seen yesterday in [facility] for. I fell twice yesterday morning and I have neuropathy per pt. Right ankle splint in place. Having numbness today per pt. I have been using crutches and a walker at home.

## 2015-03-27 NOTE — Discharge Instructions (Signed)
Keep right foot elevated. No evidence of any, cocaine factors from the splint. They should follow-up with orthopedics as arranged for tomorrow. No weightbearing on the foot use crutches at all times.

## 2015-03-30 ENCOUNTER — Ambulatory Visit: Payer: BLUE CROSS/BLUE SHIELD | Admitting: Obstetrics

## 2015-03-30 ENCOUNTER — Other Ambulatory Visit: Payer: BLUE CROSS/BLUE SHIELD

## 2015-03-30 NOTE — Telephone Encounter (Signed)
CLOSE ENCOUNTER °

## 2015-04-05 ENCOUNTER — Ambulatory Visit (HOSPITAL_COMMUNITY)
Admission: RE | Admit: 2015-04-05 | Payer: BLUE CROSS/BLUE SHIELD | Source: Ambulatory Visit | Admitting: Orthopaedic Surgery

## 2015-04-05 ENCOUNTER — Encounter (HOSPITAL_COMMUNITY): Admission: RE | Payer: Self-pay | Source: Ambulatory Visit

## 2015-04-05 SURGERY — OPEN REDUCTION INTERNAL FIXATION (ORIF) ANKLE FRACTURE
Anesthesia: General | Laterality: Right

## 2015-04-28 ENCOUNTER — Telehealth: Payer: Self-pay | Admitting: *Deleted

## 2015-04-28 NOTE — Telephone Encounter (Signed)
Patient is interested in rescheduling her appointment for her IUD removal.  Attempted to contact the patient and left message for patient to call the office.

## 2015-04-29 NOTE — Telephone Encounter (Signed)
Patient contacted the office. Attempted to contact the patient and left message for patient to call the office.

## 2015-05-11 NOTE — Telephone Encounter (Signed)
Patient contacted the office on 05-10-15 after 2 pm. Attempted to return the call on 05-11-15 and left message for patient to call the office.

## 2015-05-17 IMAGING — CR DG ANKLE PORT 2V*R*
2 series · 2 of 2 positions shown · non-contrast
Comparison: None.

CLINICAL DATA: Per ED note: Per EMS, pt comes from home after
falling on her right ankle. Pt was getting kid ready for a race and
states her right leg gave out and fell. Pt [id number], NAD noted. Pt
has a closed right ankle injuy with an abrasion

EXAM:
PORTABLE RIGHT ANKLE - 2 VIEW

[AP]
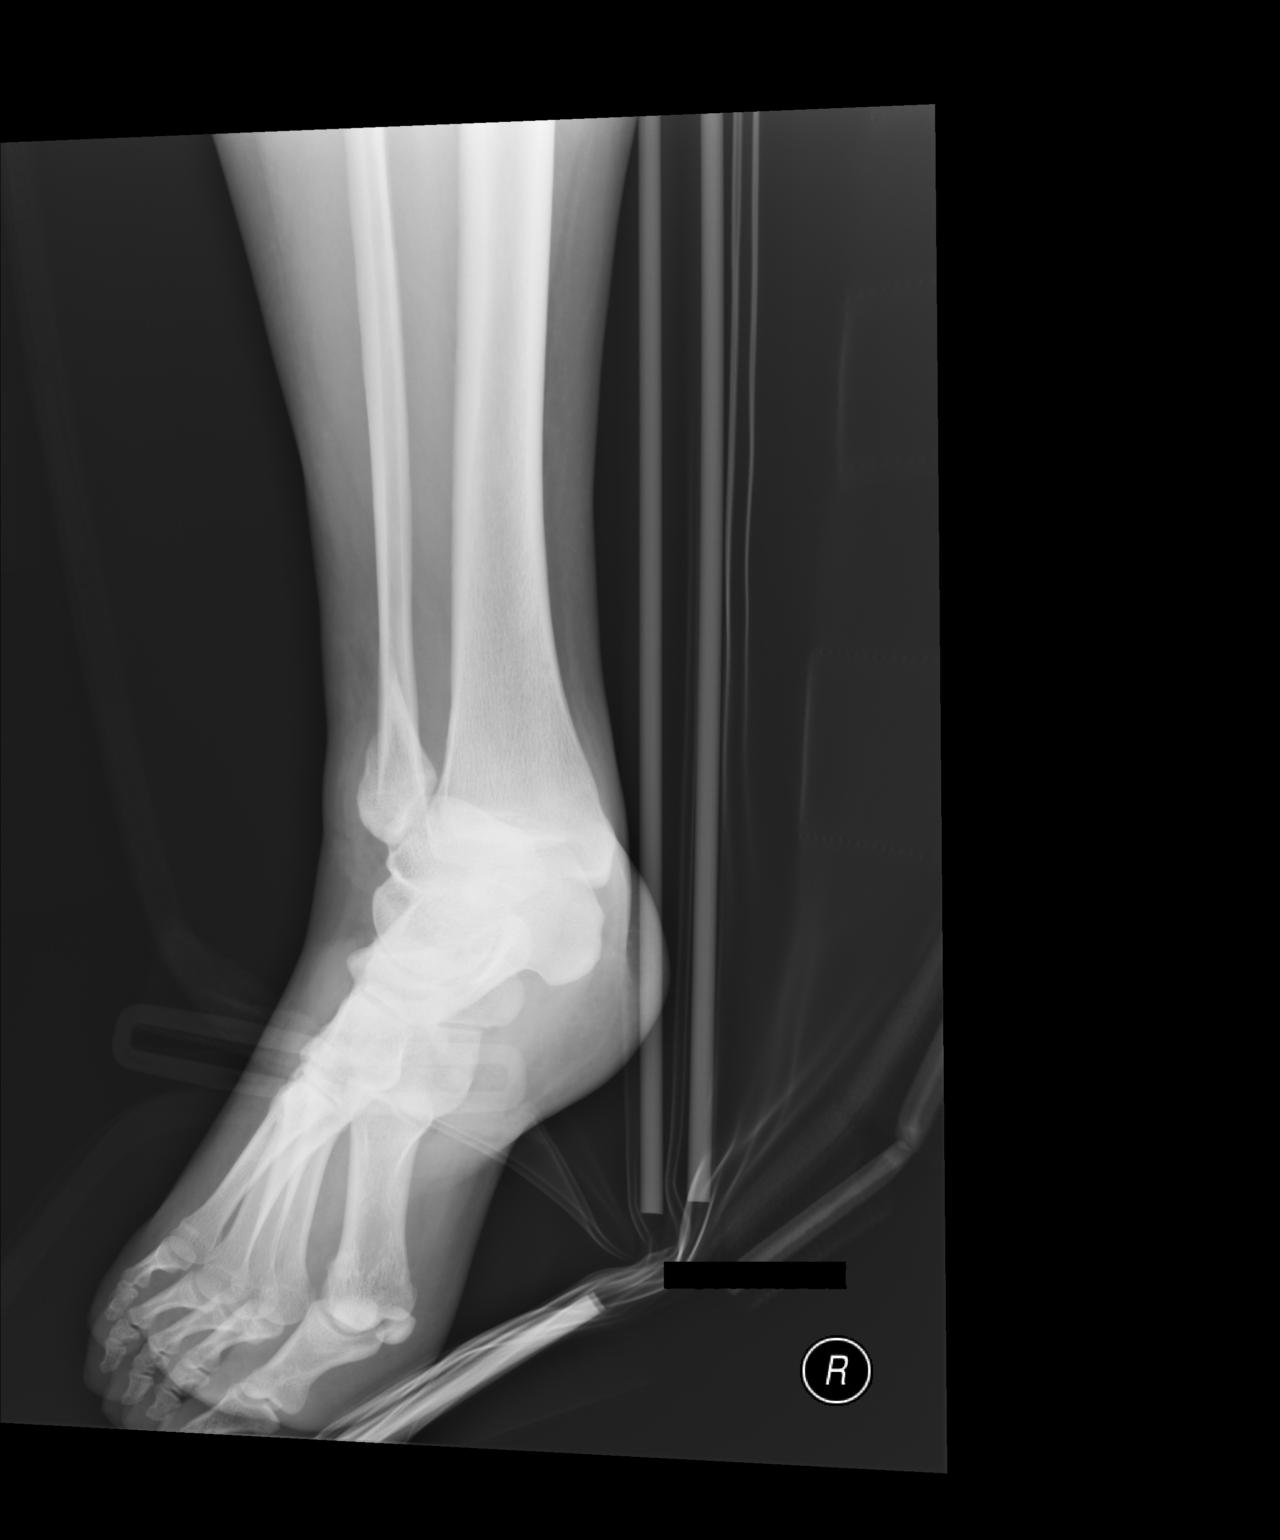

[lateral]
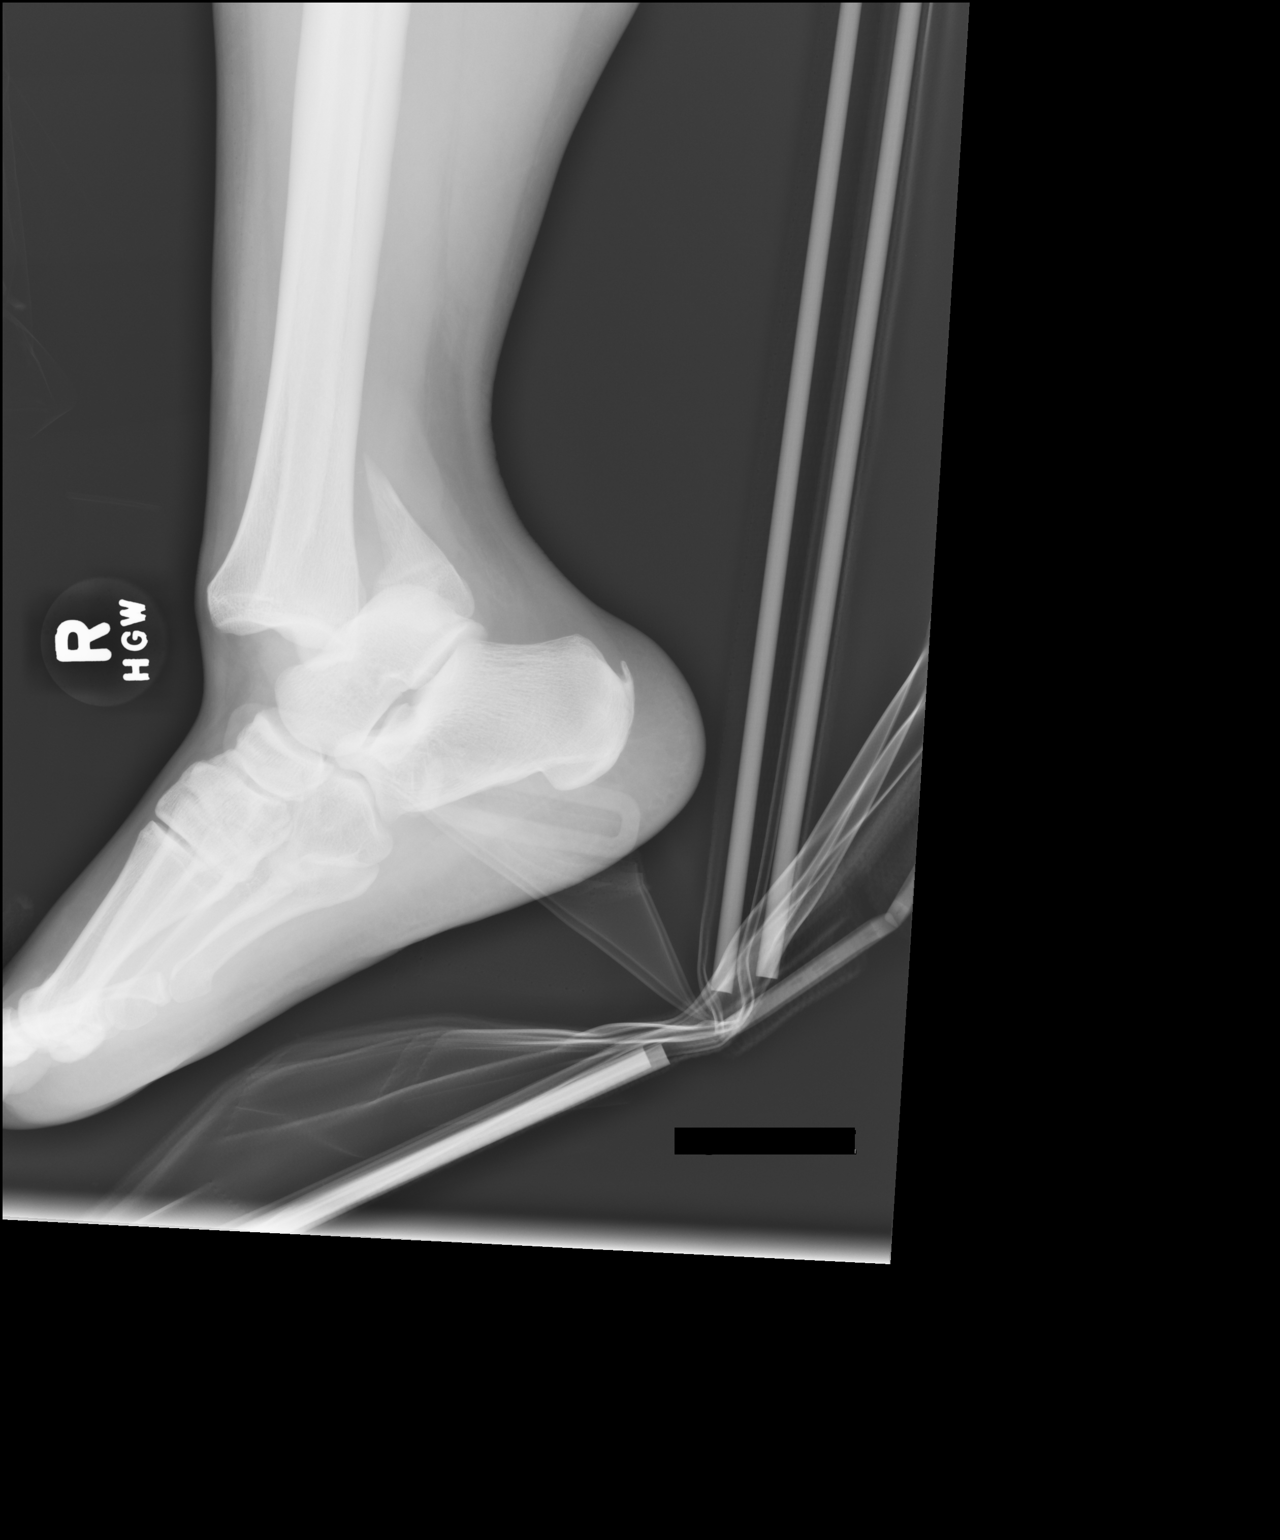

[2 of 2 positions shown; findings below may reference images not displayed]

FINDINGS: There is a fracture displacement of the right ankle. There is an
oblique fracture of the distal fibula. There is a fracture of the
distal tibia wanted along the coronal plane. The talus has displaced
posteriorly in relation to the tibia and fibula. The medial
malleolar fracture suspected but not directly seen along the images
provided.

No other fractures.
IMPRESSION: Fractures of the distal fibula and tibia with displacement of the
talus posteriorly in relation to the tibia and fibula.

## 2015-05-23 NOTE — Telephone Encounter (Signed)
Patient has been contacted and an appointment has been scheduled for 06-23-15 @ 2:00 pm for an ultrasound and an appointment with Dr. Jodi Mourning at 2:30 pm.

## 2015-05-30 ENCOUNTER — Ambulatory Visit (INDEPENDENT_AMBULATORY_CARE_PROVIDER_SITE_OTHER): Payer: BC Managed Care – PPO | Admitting: Ophthalmology

## 2015-06-14 DIAGNOSIS — F331 Major depressive disorder, recurrent, moderate: Secondary | ICD-10-CM | POA: Insufficient documentation

## 2015-06-23 ENCOUNTER — Ambulatory Visit: Payer: BLUE CROSS/BLUE SHIELD | Admitting: Obstetrics

## 2015-06-23 ENCOUNTER — Other Ambulatory Visit: Payer: BLUE CROSS/BLUE SHIELD

## 2015-06-30 ENCOUNTER — Other Ambulatory Visit: Payer: BLUE CROSS/BLUE SHIELD

## 2015-06-30 ENCOUNTER — Ambulatory Visit (INDEPENDENT_AMBULATORY_CARE_PROVIDER_SITE_OTHER): Payer: BLUE CROSS/BLUE SHIELD | Admitting: Obstetrics

## 2015-06-30 ENCOUNTER — Ambulatory Visit: Payer: BLUE CROSS/BLUE SHIELD | Admitting: Obstetrics

## 2015-06-30 ENCOUNTER — Encounter: Payer: Self-pay | Admitting: Obstetrics

## 2015-06-30 ENCOUNTER — Ambulatory Visit (INDEPENDENT_AMBULATORY_CARE_PROVIDER_SITE_OTHER): Payer: BLUE CROSS/BLUE SHIELD

## 2015-06-30 VITALS — BP 129/84 | HR 61 | Temp 98.2°F | Ht 63.0 in | Wt 215.2 lb

## 2015-06-30 DIAGNOSIS — T8389XA Other specified complication of genitourinary prosthetic devices, implants and grafts, initial encounter: Secondary | ICD-10-CM

## 2015-06-30 DIAGNOSIS — N939 Abnormal uterine and vaginal bleeding, unspecified: Secondary | ICD-10-CM

## 2015-06-30 DIAGNOSIS — Z538 Procedure and treatment not carried out for other reasons: Secondary | ICD-10-CM

## 2015-06-30 DIAGNOSIS — Z975 Presence of (intrauterine) contraceptive device: Secondary | ICD-10-CM | POA: Diagnosis not present

## 2015-07-01 ENCOUNTER — Telehealth: Payer: Self-pay

## 2015-07-01 NOTE — Telephone Encounter (Signed)
PER DR HARPER, FAXED ULTRASOUND REPORT TO DR. Barrie Dunker AT [facility]

## 2015-07-02 ENCOUNTER — Encounter: Payer: Self-pay | Admitting: Obstetrics

## 2015-07-02 NOTE — Progress Notes (Signed)
Subjective:    Sheri Roberts is a 39 y.o. female who presents for removal of IUD.  The IUD has been in for 5 years. The patient has no complaints today. The patient is sexually active. Pertinent past medical history: none.  The information documented in the HPI was reviewed and verified.  Menstrual History: OB History    Gravida Para Term Preterm AB TAB SAB Ectopic Multiple Living   4 4 4       4        Patient's last menstrual period was 06/14/2015.   Patient Active Problem List   Diagnosis Date Noted  . Chronic pelvic pain in female 06/07/2014  . IUD threads lost 06/07/2014   Past Medical History  Diagnosis Date  . Cyst of eye   . Hypertension   . Idiopathic neuropathy     Past Surgical History  Procedure Laterality Date  . Spinal tap    . Ankle surgery Right March 26 2015    seven screws put in left ankle due to a fall     Current outpatient prescriptions:  .  bisoprolol-hydrochlorothiazide (ZIAC) 5-6.25 MG per tablet, Take 1 tablet by mouth at bedtime. , Disp: , Rfl:  .  cholecalciferol (VITAMIN D) 1000 UNITS tablet, Take 1,000 Units by mouth daily., Disp: , Rfl:  .  EPINEPHrine (EPIPEN) 0.3 mg/0.3 mL IJ SOAJ injection, Inject into the muscle once., Disp: , Rfl:  .  eszopiclone (LUNESTA) 2 MG TABS tablet, Take 2 mg by mouth at bedtime. Take immediately before bedtime, Disp: , Rfl:  .  ferrous sulfate 325 (65 FE) MG tablet, Take 325 mg by mouth daily with breakfast., Disp: , Rfl:  .  fluticasone (FLONASE) 50 MCG/ACT nasal spray, Place 2 sprays into both nostrils daily as needed for allergies or rhinitis. , Disp: , Rfl:  .  gabapentin (NEURONTIN) 300 MG capsule, Take 300 mg by mouth 3 (three) times daily., Disp: , Rfl:  .  HYDROcodone-acetaminophen (NORCO) 10-325 MG per tablet, Take 1 tablet by mouth every 6 (six) hours as needed for moderate pain or severe pain., Disp: , Rfl:  .  hydrOXYzine (ATARAX/VISTARIL) 25 MG tablet, Take 25 mg by mouth 4 (four) times daily. ,  Disp: , Rfl:  .  omeprazole (PRILOSEC) 20 MG capsule, Take 20 mg by mouth every morning., Disp: , Rfl:  .  Potassium Gluconate 595 MG CAPS, Take 1 capsule by mouth every evening., Disp: , Rfl:  .  amitriptyline (ELAVIL) 50 MG tablet, Take 50 mg by mouth at bedtime as needed for sleep. , Disp: , Rfl:  .  Armodafinil (NUVIGIL) 250 MG tablet, Take 250 mg by mouth every morning. , Disp: , Rfl:  .  clotrimazole-betamethasone (LOTRISONE) cream, Apply 1 application topically daily as needed (for irritation)., Disp: , Rfl:  .  FLUoxetine (PROZAC) 10 MG capsule, Take 10 mg by mouth daily., Disp: , Rfl:  .  furosemide (LASIX) 20 MG tablet, Take 20 mg by mouth 2 (two) times daily. , Disp: , Rfl:  .  LIDOCAINE-HYDROCORTISONE ACE RE, Place 1 application rectally daily as needed (for hemorrhoids)., Disp: , Rfl:  .  meloxicam (MOBIC) 15 MG tablet, Take 15 mg by mouth daily., Disp: , Rfl:  .  oxyCODONE-acetaminophen (PERCOCET/ROXICET) 5-325 MG per tablet, Take 1-2 tablets by mouth every 6 (six) hours as needed for severe pain. (Patient not taking: Reported on 06/30/2015), Disp: 30 tablet, Rfl: 0 .  traZODone (DESYREL) 50 MG tablet, Take 25-50 mg by mouth  every evening. , Disp: , Rfl:  Allergies  Allergen Reactions  . Bee Venom Swelling  . Penicillins Hives  . Sulfa Antibiotics Hives    History  Substance Use Topics  . Smoking status: Former Research scientist (life sciences)  . Smokeless tobacco: Never Used  . Alcohol Use: No    Family History  Problem Relation Age of Onset  . Stroke Mother   . Diabetes Other   . Glaucoma Other   . Hypertension Other        Review of Systems Constitutional: negative for weight loss Genitourinary:negative for abnormal menstrual periods and vaginal discharge   Objective:   BP 129/84 mmHg  Pulse 61  Temp(Src) 98.2 F (36.8 C)  Ht 5\' 3"  (1.6 m)  Wt 215 lb 3.2 oz (97.614 kg)  BMI 38.13 kg/m2  LMP 06/14/2015           General:  Alert and no distress. Abdomen:  normal findings: no  organomegaly, soft, non-tender and no hernia  Pelvis:  External genitalia: normal general appearance Urinary system: urethral meatus normal and bladder without fullness, nontender Vaginal: normal without tenderness, induration or masses Cervix: normal appearance.  IUD string not visible.  Attempted to grasp IUD with long dressing forceps without success. Adnexa: normal bimanual exam Uterus: anteverted and non-tender, normal size   Lab Review Urine pregnancy test Labs reviewed no Radiologic studies reviewed no    Assessment:    39 y.o., discontinuing IUD, no contraindications.   Attempted IUD removal, unsuccessful.  Plan:    Patient will need Hysteroscopy with removal of IUD.  No orders of the defined types were placed in this encounter.   No orders of the defined types were placed in this encounter.

## 2015-07-05 ENCOUNTER — Other Ambulatory Visit: Payer: BLUE CROSS/BLUE SHIELD

## 2015-08-08 ENCOUNTER — Telehealth: Payer: Self-pay

## 2015-08-08 ENCOUNTER — Institutional Professional Consult (permissible substitution): Payer: BLUE CROSS/BLUE SHIELD | Admitting: Neurology

## 2015-08-08 NOTE — Telephone Encounter (Signed)
Patient did not show to appt today  

## 2015-08-10 ENCOUNTER — Encounter: Payer: Self-pay | Admitting: Neurology

## 2015-10-12 ENCOUNTER — Institutional Professional Consult (permissible substitution): Payer: BLUE CROSS/BLUE SHIELD | Admitting: Neurology

## 2015-10-12 ENCOUNTER — Telehealth: Payer: Self-pay

## 2015-10-12 NOTE — Telephone Encounter (Signed)
I spoke to patient to reschedule appt today due to doctor out sick. Patient stated that she wanted to hold off on rescheduling. Reports that she is not sleeping due to family stress. She has referral with psychiatry and states that she wants to start there. She will call back if she wants to reschedule.

## 2015-11-21 NOTE — Telephone Encounter (Signed)
I left patient a message asking her to have PCP fax [place] a new referral and we will call her to get her scheduled for sleep consultation.

## 2015-11-21 NOTE — Telephone Encounter (Signed)
Patient is calling back and states that in your conversation with her on 11/2 you stated she might have insomnia and might not have sleep apnea.  She would like to move forward to find out what is going on with her and should she make an appointment to see the doctor for an examination or how to move forward.  Please call [phone].  Thanks!

## 2016-06-26 DIAGNOSIS — H2513 Age-related nuclear cataract, bilateral: Secondary | ICD-10-CM | POA: Insufficient documentation

## 2016-06-26 DIAGNOSIS — H35033 Hypertensive retinopathy, bilateral: Secondary | ICD-10-CM | POA: Insufficient documentation

## 2017-10-07 DIAGNOSIS — G43709 Chronic migraine without aura, not intractable, without status migrainosus: Secondary | ICD-10-CM | POA: Insufficient documentation

## 2017-12-25 ENCOUNTER — Encounter: Payer: Self-pay | Admitting: Family Medicine

## 2017-12-25 DIAGNOSIS — D649 Anemia, unspecified: Secondary | ICD-10-CM | POA: Insufficient documentation

## 2017-12-26 ENCOUNTER — Ambulatory Visit: Payer: BLUE CROSS/BLUE SHIELD | Admitting: Family Medicine

## 2017-12-26 ENCOUNTER — Encounter: Payer: Self-pay | Admitting: Family Medicine

## 2017-12-26 VITALS — BP 128/72 | HR 84 | Temp 97.6°F | Ht 63.0 in | Wt 227.0 lb

## 2017-12-26 DIAGNOSIS — D573 Sickle-cell trait: Secondary | ICD-10-CM | POA: Diagnosis not present

## 2017-12-26 DIAGNOSIS — F419 Anxiety disorder, unspecified: Secondary | ICD-10-CM

## 2017-12-26 DIAGNOSIS — F32A Depression, unspecified: Secondary | ICD-10-CM | POA: Insufficient documentation

## 2017-12-26 DIAGNOSIS — Z7689 Persons encountering health services in other specified circumstances: Secondary | ICD-10-CM

## 2017-12-26 DIAGNOSIS — G8929 Other chronic pain: Secondary | ICD-10-CM | POA: Insufficient documentation

## 2017-12-26 DIAGNOSIS — F329 Major depressive disorder, single episode, unspecified: Secondary | ICD-10-CM | POA: Diagnosis not present

## 2017-12-26 DIAGNOSIS — I10 Essential (primary) hypertension: Secondary | ICD-10-CM

## 2017-12-26 DIAGNOSIS — Z114 Encounter for screening for human immunodeficiency virus [HIV]: Secondary | ICD-10-CM

## 2017-12-26 LAB — BAYER DCA HB A1C WAIVED: HB A1C: 5.4 % (ref <7.0)

## 2017-12-26 NOTE — Patient Instructions (Signed)
As we discussed, you are on multiple sedating medications that concomitant use of Xanax with increased risk for respiratory depression and adverse events.  Because your symptoms are not well controlled on your current antidepressant medications, I do recommend that you see a psychiatrist.  I think this would be extremely beneficial to you, especially given your long-standing history of migraine headaches.  I wonder if there is not a single medication that may control multiple symptoms.  Below is a list of options.  Please contact whomever you desire to see and schedule an appointment at your earliest convenience.  I would do this ASAP, given your increased anxiety.  Please call me if you need a referral.  You had labs performed today.  You will be contacted with the results of the labs once they are available, usually in the next 3 days for routine lab work.   Your provider wants you to schedule an appointment with a Psychologist/Psychiatrist. The following list of offices requires the patient to call and make their own appointment, as there is information they need that only you can provide. Please feel free to choose form the following providers:  [facility] in [place]  [place]  [phone] [place], [place]  (Scheduled through [facility]) Must call and do an interview for appointment. Sees Children / Accepts Medicaid  [facility] [place]  [address] [place]    [place], [place]  [phone] [place], [place] for Autism but does not treat it Sees Children / Accepts Medicaid  [facility]    [phone] [address], [place], [place] Medication management, substance abuse, bipolar, grief, family, marriage, OCD, anxiety, PTSD Sees children / Accepts Medicaid  [facility]     [phone] [address] [place], [place] children / Accepts [facility]  [facility]  [phone] [address] [place], [place]   Dr Lorenza Evangelist     [phone] [address] [place], [place]  Sees ADD & ADHD for treatment Accepts Medicaid  [facility]  [phone] [address] Arlean Hopping, [place] for Autism Accepts [facility]  [facility]  [phone] [place], [place]  Does Adult ADD evaluations Does not accept Medicaid  [facility]   [phone] [place], [place] children as young as 45 years old Accepts [facility]     [phone]    [place], [place] [id number] Sees children Accepts Medicaid

## 2017-12-26 NOTE — Progress Notes (Signed)
Subjective: VQ:[id number] care, anxiety, depression, neuropathy HPI: Sheri Roberts is a 42 y.o. female presenting to clinic today for:  1.  Anxiety and depression Patient reports that she is on multiple medications for anxiety depression.  She was first diagnosed about 5 years ago after the passing of her mother.  She has multiple stressors including a husband who left her to care for her 4 children by herself.  She also notes that she cares for her disabled sister who resides with her.  She reports that she started smoking again to deal with anxiety.  She notes chronic pain in worsening health, which cause increased depression and anxiety.  She notes that she was seeing a provider at [facility] who started her on a new medication called Vraylar, which she does not feel is helping very much.  She is also on Trentellix, Zoloft, Belsomra and Depakote.  She notes that the Depakote is primarily for chronic headaches.  She sees neurology for headaches.  Next appointment is later this month.  She repeatedly notes that Xanax was helpful but she has not had this in greater than 1 year.  She has never seen a psychiatrist.  She is never been hospitalized for mental illness.  [facility]  She has an upcoming appointment.  She is unsure of family history of mental illness, as she was adopted.  2. Chronic pain /idiopathic peripheral neuropathy. She is under the care of Dr. Lunette Stands for chronic pain management.  She is currently on oxycodone, gabapentin, tizanidine, Voltaren gel.  She notes that she has been trying to get Lyrica but she is needed prior authorization.  This is being worked on by her provider.  She notes that pain is primarily in the right ankle, which she has fractured and dislocated in the past.  She notes surgery on this ankle in the past as well.   Past Medical History:  Diagnosis Date  . Cyst of eye   . Endometriosis of uterus   . Headache   . Hypertension   .  Idiopathic neuropathy   . Morbid obesity ([place])   . Neuralgia   . Sickle cell trait [facility])    Past Surgical History:  Procedure Laterality Date  . ANKLE SURGERY Right March 26 2015   seven screws put in left ankle due to a fall  . spinal tap     Social History   Socioeconomic History  . Marital status: Married    Spouse name: Not on file  . Number of children: Not on file  . Years of education: Not on file  . Highest education level: Not on file  Social Needs  . Financial resource strain: Not on file  . Food insecurity - worry: Not on file  . Food insecurity - inability: Not on file  . Transportation needs - medical: Not on file  . Transportation needs - non-medical: Not on file  Occupational History  . Not on file  Tobacco Use  . Smoking status: Current Every Day Smoker    Packs/day: 0.10    Years: 15.00    Pack years: 1.50    Types: Cigarettes  . Smokeless tobacco: Never Used  Substance and Sexual Activity  . Alcohol use: No  . Drug use: No  . Sexual activity: Not Currently    Partners: Male    Birth control/protection: IUD  Other Topics Concern  . Not on file  Social History Narrative  . Not on file  Current Meds  Medication Sig  . ALPRAZolam (XANAX) 0.25 MG tablet Take 0.25 mg by mouth 4 (four) times daily.  . bisoprolol-hydrochlorothiazide (ZIAC) 5-6.25 MG per tablet Take 1 tablet by mouth at bedtime.   . cariprazine (VRAYLAR) capsule Take by mouth daily.  . divalproex (DEPAKOTE) 125 MG DR tablet Take 125 mg by mouth 2 (two) times daily.  Marland Kitchen EPINEPHrine (EPIPEN) 0.3 mg/0.3 mL IJ SOAJ injection Inject into the muscle once.  . fluticasone (FLONASE) 50 MCG/ACT nasal spray Place 2 sprays into both nostrils daily as needed for allergies or rhinitis.   Marland Kitchen gabapentin (NEURONTIN) 300 MG capsule Take 300 mg by mouth 2 (two) times daily.   Marland Kitchen oxyCODONE-acetaminophen (PERCOCET/ROXICET) 5-325 MG per tablet Take 1-2 tablets by mouth every 6 (six) hours as needed for severe  pain.  . pregabalin (LYRICA) 150 MG capsule Take 150 mg by mouth 4 (four) times daily.  . sertraline (ZOLOFT) 100 MG tablet Take 150 mg by mouth daily.  . Suvorexant (BELSOMRA) 15 MG TABS Take 15 mg by mouth at bedtime.  . vortioxetine HBr (TRINTELLIX) 10 MG TABS Take 10 mg by mouth daily.  . [DISCONTINUED] cholecalciferol (VITAMIN D) 1000 UNITS tablet Take 1,000 Units by mouth daily.  . [DISCONTINUED] ferrous sulfate 325 (65 FE) MG tablet Take 325 mg by mouth daily with breakfast.   Family History  Adopted: Yes  Problem Relation Age of Onset  . Stroke Mother 71  . Diabetes Other   . Glaucoma Other   . Hypertension Other   . Mental illness Sister   . Mental retardation Sister    Allergies  Allergen Reactions  . Bee Venom Swelling  . Penicillins Hives  . Sulfa Antibiotics Hives     ROS: Per HPI  Objective: Office vital signs reviewed. BP 128/72   Pulse 84   Temp 97.6 F (36.4 C) (Oral)   Ht '5\' 3"'$  (1.6 m)   Wt 227 lb (103 kg)   BMI 40.21 kg/m   Physical Examination:  General: Awake, alert, morbidly obese, No acute distress HEENT: Sclera white, moist mucous membranes Cardio: regular rate and rhythm, S1S2 heard, no murmurs appreciated Pulm: clear to auscultation bilaterally, no wheezes, rhonchi or rales; normal work of breathing on room air MSK: Ambulates independently.  Normal tone. Psych: Mood stable, speech normal, eye contact fair.  She is somewhat evasive.  Depression screen PHQ 2/9 12/26/2017  Decreased Interest 1  Down, Depressed, Hopeless 2  PHQ - 2 Score 3  Altered sleeping 2  Tired, decreased energy 1  Change in appetite 0  Feeling bad or failure about yourself  0  Trouble concentrating 0  Moving slowly or fidgety/restless 0  Suicidal thoughts 1  PHQ-9 Score 7  Difficult doing work/chores Somewhat difficult   GAD 7 : Generalized Anxiety Score 12/26/2017  Nervous, Anxious, on Edge 1  Control/stop worrying 3  Worry too much - different things 3    Trouble relaxing 2  Restless 2  Easily annoyed or irritable 1  Afraid - awful might happen 0  Anxiety Difficulty Very difficult    Assessment/ Plan: 42 y.o. female   1. Benign essential HTN Blood pressure controlled on current regimen.  Check basic labs, as this is not been done in some time for her. - CMP14+EGFR - Lipid Panel  2. Screening for HIV without presence of risk factors - HIV antibody  3. Encounter to establish care with new doctor Release of information form completed today.  Would like to  obtain records from pain management, previous PCP and neurologist.  4. Anxiety and depression Patient is on multiple medications.  She seems to be hyper focused on Xanax as what will improve her symptoms.  We discussed at length that I did not think that a benzodiazepine would be appropriate, especially considering that she is on good amounts of oxycodone and other sedating medications.  We reviewed not only risk to self but of those that she is caring for.  I referred her to psychiatry and provided a list of local psychiatrists.  I highly encouraged that she establish with them.  I think that she would benefit from medication review and potentially changing/condensing medications.  For now continue current regimen.  She is not a risk to self or others.  Safe for discharge home. - TSH  5. Morbid obesity ([place]) Check basic labs.  Encourage lifestyle modifications. - TSH - Bayer DCA Hb A1c Waived  6. Sickle-cell trait ([place]) Screen for anemia.  Patient has a known sickle cell trait history. - CBC with Differential   Janora Norlander, [place] [phone]

## 2017-12-27 LAB — LIPID PANEL
CHOL/HDL RATIO: 3.1 ratio (ref 0.0–4.4)
Cholesterol, Total: 129 mg/dL (ref 100–199)
HDL: 42 mg/dL (ref >39)
LDL Calculated: 65 mg/dL (ref 0–99)
Triglycerides: 112 mg/dL (ref 0–149)
VLDL Cholesterol Cal: 22 mg/dL (ref 5–40)

## 2017-12-27 LAB — CMP14+EGFR
ALBUMIN: 4.1 g/dL (ref 3.5–5.5)
ALT: 8 IU/L (ref 0–32)
AST: 10 IU/L (ref 0–40)
Albumin/Globulin Ratio: 1.6 (ref 1.2–2.2)
Alkaline Phosphatase: 101 IU/L (ref 39–117)
BILIRUBIN TOTAL: 0.2 mg/dL (ref 0.0–1.2)
BUN / CREAT RATIO: 14 (ref 9–23)
BUN: 12 mg/dL (ref 6–24)
CHLORIDE: 106 mmol/L (ref 96–106)
CO2: 21 mmol/L (ref 20–29)
Calcium: 9.2 mg/dL (ref 8.7–10.2)
Creatinine, Ser: 0.86 mg/dL (ref 0.57–1.00)
GFR calc non Af Amer: 85 mL/min/{1.73_m2} (ref >59)
GFR, EST AFRICAN AMERICAN: 98 mL/min/{1.73_m2} (ref >59)
Globulin, Total: 2.6 g/dL (ref 1.5–4.5)
Glucose: 84 mg/dL (ref 65–99)
Potassium: 4.1 mmol/L (ref 3.5–5.2)
Sodium: 143 mmol/L (ref 134–144)
TOTAL PROTEIN: 6.7 g/dL (ref 6.0–8.5)

## 2017-12-27 LAB — CBC WITH DIFFERENTIAL/PLATELET
Basophils Absolute: 0 10*3/uL (ref 0.0–0.2)
Basos: 0 %
EOS (ABSOLUTE): 0.3 10*3/uL (ref 0.0–0.4)
Eos: 5 %
Hematocrit: 34.9 % (ref 34.0–46.6)
Hemoglobin: 11.8 g/dL (ref 11.1–15.9)
Immature Grans (Abs): 0 10*3/uL (ref 0.0–0.1)
Immature Granulocytes: 0 %
LYMPHS ABS: 2.2 10*3/uL (ref 0.7–3.1)
Lymphs: 32 %
MCH: 26.3 pg — AB (ref 26.6–33.0)
MCHC: 33.8 g/dL (ref 31.5–35.7)
MCV: 78 fL — ABNORMAL LOW (ref 79–97)
Monocytes Absolute: 0.7 10*3/uL (ref 0.1–0.9)
Monocytes: 10 %
Neutrophils Absolute: 3.7 10*3/uL (ref 1.4–7.0)
Neutrophils: 53 %
Platelets: 295 10*3/uL (ref 150–379)
RBC: 4.49 x10E6/uL (ref 3.77–5.28)
RDW: 16.7 % — ABNORMAL HIGH (ref 12.3–15.4)
WBC: 7 10*3/uL (ref 3.4–10.8)

## 2017-12-27 LAB — TSH: TSH: 1.75 u[IU]/mL (ref 0.450–4.500)

## 2017-12-27 LAB — HIV ANTIBODY (ROUTINE TESTING W REFLEX): HIV Screen 4th Generation wRfx: NONREACTIVE

## 2018-03-14 ENCOUNTER — Telehealth: Payer: Self-pay | Admitting: Family Medicine

## 2018-03-14 ENCOUNTER — Other Ambulatory Visit: Payer: Self-pay | Admitting: Family Medicine

## 2018-03-14 MED ORDER — BISOPROLOL-HYDROCHLOROTHIAZIDE 5-6.25 MG PO TABS: 1.0000 | ORAL_TABLET | Freq: Every day | ORAL | 4 refills | Status: DC

## 2018-03-14 MED ORDER — CETIRIZINE HCL 10 MG PO TABS: 10.0000 mg | ORAL_TABLET | Freq: Every day | ORAL | 4 refills | Status: AC

## 2018-03-14 NOTE — Telephone Encounter (Signed)
I have sent in the Cetirizine and Ziac.  The Belsomra is something that I thought her Psychiatrist was prescribing.  It is a controlled substance and will need to come from her Psychiatrist.

## 2018-03-17 ENCOUNTER — Ambulatory Visit: Payer: BLUE CROSS/BLUE SHIELD | Admitting: Family Medicine

## 2018-03-17 NOTE — Telephone Encounter (Signed)
Aware. 

## 2018-12-22 ENCOUNTER — Other Ambulatory Visit: Payer: Self-pay | Admitting: Family Medicine

## 2018-12-22 NOTE — Telephone Encounter (Signed)
Last seen 10/16/18

## 2019-05-28 ENCOUNTER — Other Ambulatory Visit: Payer: Self-pay | Admitting: Family Medicine

## 2020-02-03 ENCOUNTER — Ambulatory Visit (INDEPENDENT_AMBULATORY_CARE_PROVIDER_SITE_OTHER): Payer: Medicaid Other

## 2020-02-03 ENCOUNTER — Other Ambulatory Visit: Payer: Self-pay

## 2020-02-03 VITALS — BP 137/80 | HR 80 | Ht 63.0 in | Wt 261.0 lb

## 2020-02-03 DIAGNOSIS — Z3201 Encounter for pregnancy test, result positive: Secondary | ICD-10-CM | POA: Diagnosis not present

## 2020-02-03 DIAGNOSIS — Z3480 Encounter for supervision of other normal pregnancy, unspecified trimester: Secondary | ICD-10-CM

## 2020-02-03 DIAGNOSIS — O0990 Supervision of high risk pregnancy, unspecified, unspecified trimester: Secondary | ICD-10-CM | POA: Insufficient documentation

## 2020-02-03 LAB — POCT URINE PREGNANCY: Preg Test, Ur: POSITIVE — AB

## 2020-02-03 MED ORDER — BLOOD PRESSURE KIT DEVI: 1.0000 | 0 refills | Status: AC

## 2020-02-03 NOTE — Progress Notes (Signed)
Sheri Roberts presents today for UPT. She has no unusual complaints and complains of vaginal spotting sometimes.  LMP:12/15/2019    OBJECTIVE: Appears well, in no apparent distress.  OB History    Gravida  7   Para  4   Term  4   Preterm      AB  2   Living  4     SAB  2   TAB      Ectopic      Multiple      Live Births  4          Home UPT Result:POSITIVE In-Office UPT result:POSITIVE  I have reviewed the patient's medical, obstetrical, social, and family histories, and medications.   ASSESSMENT: Positive pregnancy test  PLAN Prenatal care to be completed at: [facility]  -------------------------------------------------------- PRENATAL INTAKE SUMMARY  Sheri Roberts presents today New OB Nurse Interview.  OB History    Gravida  7   Para  4   Term  4   Preterm      AB  2   Living  4     SAB  2   TAB      Ectopic      Multiple      Live Births  4          I have reviewed the patient's medical, obstetrical, social, and family histories, medications, and available lab results.  SUBJECTIVE She has no unusual complaints and complains of vaginal spotting.  OBJECTIVE Intake (New OB)  GENERAL APPEARANCE: alert, well appearing   ASSESSMENT Normal pregnancy  PLAN Prenatal care @ [facility]

## 2020-02-04 NOTE — Progress Notes (Signed)
Patient seen and assessed by nursing staff during this encounter. I have reviewed the chart and agree with the documentation and plan.  Mora Bellman, MD 02/04/2020 10:24 AM

## 2020-02-08 ENCOUNTER — Encounter: Payer: Self-pay | Admitting: Obstetrics

## 2020-03-02 ENCOUNTER — Encounter: Payer: Self-pay | Admitting: Obstetrics & Gynecology

## 2020-03-02 ENCOUNTER — Ambulatory Visit (INDEPENDENT_AMBULATORY_CARE_PROVIDER_SITE_OTHER): Payer: Medicaid Other | Admitting: Obstetrics & Gynecology

## 2020-03-02 ENCOUNTER — Other Ambulatory Visit: Payer: Self-pay

## 2020-03-02 ENCOUNTER — Telehealth: Payer: Self-pay

## 2020-03-02 ENCOUNTER — Encounter: Payer: Self-pay | Admitting: Obstetrics

## 2020-03-02 VITALS — BP 146/84 | HR 88 | Wt 257.8 lb

## 2020-03-02 DIAGNOSIS — O10919 Unspecified pre-existing hypertension complicating pregnancy, unspecified trimester: Secondary | ICD-10-CM | POA: Insufficient documentation

## 2020-03-02 DIAGNOSIS — F32A Depression, unspecified: Secondary | ICD-10-CM | POA: Insufficient documentation

## 2020-03-02 DIAGNOSIS — F329 Major depressive disorder, single episode, unspecified: Secondary | ICD-10-CM

## 2020-03-02 DIAGNOSIS — O10911 Unspecified pre-existing hypertension complicating pregnancy, first trimester: Secondary | ICD-10-CM

## 2020-03-02 DIAGNOSIS — O99210 Obesity complicating pregnancy, unspecified trimester: Secondary | ICD-10-CM | POA: Insufficient documentation

## 2020-03-02 DIAGNOSIS — O99341 Other mental disorders complicating pregnancy, first trimester: Secondary | ICD-10-CM

## 2020-03-02 DIAGNOSIS — O3680X0 Pregnancy with inconclusive fetal viability, not applicable or unspecified: Secondary | ICD-10-CM

## 2020-03-02 DIAGNOSIS — Z3A11 11 weeks gestation of pregnancy: Secondary | ICD-10-CM

## 2020-03-02 MED ORDER — LABETALOL HCL 100 MG PO TABS: 100.0000 mg | ORAL_TABLET | Freq: Two times a day (BID) | ORAL | 2 refills | Status: DC

## 2020-03-02 NOTE — Telephone Encounter (Signed)
Pt called office with complaints of increased  vaginal bleeding since NOB appt. Pt denies any recent intercourse, no pain. Pt advised to go to hospital for evaluation Pt voiced understanding.  Pt states while here nurse attempted to find heart tones and was pressing on her  I let pt know that will not cause vaginal  bleeding.

## 2020-03-02 NOTE — Progress Notes (Signed)
OFFICE VISIT NOTE  History:   Sheri Roberts is a 44 y.o. [id number] at [redacted]w[redacted]d by LMP here today for initial OB visit. Reports light spotting. She denies any abnormal vaginal discharge,  pelvic pain or other concerns.      Patient Active Problem List   Diagnosis Date Noted  . Chronic hypertension affecting pregnancy 03/02/2020  . Depression affecting pregnancy 03/02/2020  . Maternal morbid obesity, antepartum ([place]) 03/02/2020  . Supervision of high-risk pregnancy 02/03/2020  . Anxiety and depression 12/26/2017  . Chronic pain 12/26/2017  . Absolute anemia 12/25/2017  . Chronic migraine without aura, not intractable, without status migrainosus 10/07/2017  . Hypertensive retinopathy of both eyes 06/26/2016  . Nuclear sclerotic cataract of both eyes 06/26/2016  . Moderate episode of recurrent major depressive disorder ([place]) 06/14/2015  . Allergic rhinitis 09/21/2014  . Peripheral neuropathic pain 08/10/2014  . Benign essential HTN 08/04/2014  . Morbid obesity ([place]) 08/04/2014  . Sickle-cell trait ([place]) 08/04/2014  . Adenomyosis 08/04/2014  . Chronic pelvic pain in female 06/07/2014    Past Medical History:  Diagnosis Date  . Anemia   . Anxiety   . Arthritis   . Cyst of eye   . Depression   . Endometriosis of uterus   . Headache   . Hypertension   . Idiopathic neuropathy   . Morbid obesity ([place])   . Neuralgia   . Sickle cell trait [facility])     Past Surgical History:  Procedure Laterality Date  . ANKLE SURGERY Right March 26 2015   seven screws put in left ankle due to a fall  . spinal tap      The following portions of the patient's history were reviewed and updated as appropriate: allergies, current medications, past family history, past medical history, past social history, past surgical history and problem list.   Review of Systems:  Pertinent items noted in HPI and remainder of comprehensive ROS otherwise negative.  Physical Exam:  BP (!) 146/84   Pulse 88   Wt  257 lb 12.8 oz (116.9 kg)   LMP 12/15/2019 (Exact Date)   BMI 45.67 kg/m  CONSTITUTIONAL: Well-developed, well-nourished female in no acute distress.  MUSCULOSKELETAL: Normal range of motion. No edema noted. NEUROLOGIC: Alert and oriented to person, place, and time. Normal muscle tone coordination. No cranial nerve deficit noted. PSYCHIATRIC: Normal mood and affect. Normal behavior. Normal judgment and thought content. CARDIOVASCULAR: Normal heart rate noted RESPIRATORY: Effort and breath sounds normal, no problems with respiration noted ABDOMEN: Obese. Nontender. No masses noted. No other overt distention noted.   PELVIC: Deferred  Imaging Bedside scan: +Fetal pole in uterus much smaller than expected. Unable to ascertain viability dur to poor image quality and lack of transvaginal access.      Assessment and Plan:    1. Pregnancy with uncertain fetal viability, single or unspecified fetus Will send for formal scan, and reschedule NOB visit based on findings/dating. Patient desires genetic screening. - US OB LESS THAN 14 WEEKS W/ OB TRANSVAGINAL AND DOPPLER; Future Went through patient medication list, patient stopped everything cold [place]. Advised her to continue Zoloft for depression, switch HTN med to Labetalol 100 mg bid.  Teratogenic medications discontinued from the list (she is not taking them anyway).  Advised about other medications she can take as needed.   No follow-ups on file.    Total face-to-face time with patient: 20 minutes.  Over 50% of encounter was spent on counseling and coordination of care.  Verita Schneiders, MD, [place] for [employer], [place]

## 2020-03-05 ENCOUNTER — Encounter (HOSPITAL_COMMUNITY): Payer: Self-pay | Admitting: Obstetrics & Gynecology

## 2020-03-05 ENCOUNTER — Other Ambulatory Visit: Payer: Self-pay

## 2020-03-05 ENCOUNTER — Inpatient Hospital Stay (HOSPITAL_COMMUNITY): Payer: Medicaid Other

## 2020-03-05 ENCOUNTER — Inpatient Hospital Stay (HOSPITAL_COMMUNITY)
Admission: AD | Admit: 2020-03-05 | Discharge: 2020-03-05 | Disposition: A | Discharge status: Home / Self Care | Payer: Medicaid Other | Attending: Obstetrics & Gynecology | Admitting: Obstetrics & Gynecology

## 2020-03-05 DIAGNOSIS — M549 Dorsalgia, unspecified: Secondary | ICD-10-CM | POA: Diagnosis not present

## 2020-03-05 DIAGNOSIS — Z882 Allergy status to sulfonamides status: Secondary | ICD-10-CM | POA: Diagnosis not present

## 2020-03-05 DIAGNOSIS — Z87891 Personal history of nicotine dependence: Secondary | ICD-10-CM | POA: Insufficient documentation

## 2020-03-05 DIAGNOSIS — O209 Hemorrhage in early pregnancy, unspecified: Secondary | ICD-10-CM | POA: Diagnosis present

## 2020-03-05 DIAGNOSIS — O26891 Other specified pregnancy related conditions, first trimester: Secondary | ICD-10-CM

## 2020-03-05 DIAGNOSIS — O039 Complete or unspecified spontaneous abortion without complication: Secondary | ICD-10-CM | POA: Diagnosis not present

## 2020-03-05 DIAGNOSIS — Z3A11 11 weeks gestation of pregnancy: Secondary | ICD-10-CM | POA: Diagnosis not present

## 2020-03-05 DIAGNOSIS — Z880 Allergy status to penicillin: Secondary | ICD-10-CM | POA: Diagnosis not present

## 2020-03-05 LAB — URINALYSIS, ROUTINE W REFLEX MICROSCOPIC
Bacteria, UA: NONE SEEN
Bilirubin Urine: NEGATIVE
Glucose, UA: NEGATIVE mg/dL
Ketones, ur: NEGATIVE mg/dL
Leukocytes,Ua: NEGATIVE
Nitrite: NEGATIVE
Protein, ur: 100 mg/dL — AB
RBC / HPF: >50 RBC/hpf — ABNORMAL HIGH (ref 0–5)
Specific Gravity, Urine: 1.013 (ref 1.005–1.030)
pH: 5 (ref 5.0–8.0)

## 2020-03-05 MED ORDER — IBUPROFEN 600 MG PO TABS: 600.0000 mg | ORAL_TABLET | Freq: Four times a day (QID) | ORAL | 1 refills | Status: DC | PRN

## 2020-03-05 MED ORDER — OXYCODONE-ACETAMINOPHEN 5-325 MG PO TABS: 1.0000 | ORAL_TABLET | Freq: Four times a day (QID) | ORAL | 0 refills | Status: DC | PRN

## 2020-03-05 MED ORDER — IBUPROFEN 600 MG PO TABS
600.0000 mg | ORAL_TABLET | Freq: Once | ORAL | Status: AC
  Administered 2020-03-05: 600 mg via ORAL
  Filled 2020-03-05: qty 1

## 2020-03-05 NOTE — MAU Note (Signed)
Sheri Roberts is a 44 y.o. at [redacted]w[redacted]d here in MAU reporting: at her appointment on Wednesday they couldn't see anything on u/s. When she left her appointment she started bleeding. Went to the ER in [place] on Thursday but left prior to being seen. Hcg on Thursday was 5397. Since 0400 bleeding got heavier than what it had been. Thinks she passed the baby into the toilet. Bleeding has slowed but is feeling sore.   Onset of complaint: since Wednesday  Pain score: 9/10  Vitals:   03/05/20 1215  BP: 135/67  Pulse: 89  Resp: 16  Temp: 97.8 F (36.6 C)  SpO2: 99%     Lab orders placed from triage: UA

## 2020-03-05 NOTE — Discharge Instructions (Signed)

## 2020-03-05 NOTE — MAU Provider Note (Signed)
Chief Complaint: Abdominal Pain and Vaginal Bleeding   First Provider Initiated Contact with Patient 03/05/20 1235        SUBJECTIVE HPI: Sheri Roberts is a 44 y.o. [id number] at 64w4dby LMP who presents to maternity admissions reporting bleeding and pelvic pain.  States she passed 3 large clots and thinks one was the baby.  Was seen in clinic on 03/02/20 and found to have a pregnancy smaller than expected.  . She denies vaginal itching/burning, urinary symptoms, h/a, dizziness, n/v, or fever/chills.   States she is O+ and has never had to get Rhophylac  Abdominal Pain This is a recurrent problem. The current episode started in the past 7 days. The onset quality is gradual. The problem occurs intermittently. The problem has been unchanged. The pain is located in the RLQ, suprapubic region and LLQ. The quality of the pain is cramping. The abdominal pain does not radiate. Pertinent negatives include no constipation, diarrhea, dysuria, fever or frequency. Nothing aggravates the pain. The pain is relieved by nothing. She has tried nothing for the symptoms.  Vaginal Bleeding The patient's primary symptoms include pelvic pain and vaginal bleeding. The patient's pertinent negatives include no genital itching, genital lesions or genital odor. This is a new problem. The current episode started yesterday. The problem occurs intermittently. The problem has been unchanged. Associated symptoms include abdominal pain. Pertinent negatives include no chills, constipation, diarrhea, dysuria, fever or frequency. The vaginal discharge was bloody. The vaginal bleeding is lighter than menses. She has been passing clots. She has not been passing tissue. Nothing aggravates the symptoms. She has tried nothing for the symptoms.   RN Note: Sheri Roberts a 44y.o. at 158w4dere in MAU reporting: at her appointment on Wednesday they couldn't see anything on u/s. When she left her appointment she started bleeding. Went to the  ER in [place] on Thursday but left prior to being seen. Hcg on Thursday was 5397. Since 0400 bleeding got heavier than what it had been. Thinks she passed the baby into the toilet. Bleeding has slowed but is feeling sore.  Onset of complaint: since Wednesday Pain score: 9/10  Past Medical History:  Diagnosis Date  . Anemia   . Anxiety   . Arthritis   . Cyst of eye   . Depression   . Endometriosis of uterus   . Headache   . Hypertension   . Idiopathic neuropathy   . [place]bid [place] ([place]  . Neuralgia   . [facility]   Past Surgical History:  Procedure Laterality Date  . ANKLE SURGERY Right March 26 2015   seven screws put in left ankle due to a fall  . spinal tap     Social History   Socioeconomic History  . Marital status: Married    Spouse name: Not on file  . Number of children: Not on file  . Years of education: Not on file  . Highest education level: Not on file  Occupational History  . Occupation: [employer]  Tobacco Use  . Smoking status: Former Smoker    Packs/day: 0.10    Years: 15.00    Pack years: 1.50    Types: Cigarettes  . Smokeless tobacco: Never Used  Substance and Sexual Activity  . Alcohol use: No  . Drug use: No  . Sexual activity: Yes    Partners: Male    Birth control/protection: None  Other Topics Concern  . Not on file  Social History Narrative  . Not on file   Social Determinants of Health   Financial Resource Strain:   . Difficulty of Paying Living Expenses:   Food Insecurity:   . Worried About Charity fundraiser in the Last Year:   . Arboriculturist in the Last Year:   Transportation Needs:   . Film/video editor (Medical):   Marland Kitchen Lack of Transportation (Non-Medical):   Physical Activity:   . Days of Exercise per Week:   . Minutes of Exercise per Session:   Stress:   . Feeling of Stress :   Social Connections:   . Frequency of Communication with Friends and Family:   . Frequency of Social  Gatherings with Friends and Family:   . Attends Religious Services:   . Active Member of Clubs or Organizations:   . Attends Archivist Meetings:   Marland Kitchen Marital Status:   Intimate Partner Violence:   . Fear of Current or Ex-Partner:   . Emotionally Abused:   Marland Kitchen Physically Abused:   . Sexually Abused:    No current facility-administered medications on file prior to encounter.   Current Outpatient Medications on File Prior to Encounter  Medication Sig Dispense Refill  . Blood Pressure Monitoring (BLOOD PRESSURE KIT) DEVI 1 kit by Does not apply route once a week. Check Blood Pressure regularly and record readings into the Babyscripts App.  Large Cuff.  DX O90.0 (Patient not taking: Reported on 03/02/2020) 1 each 0  . cetirizine (ZYRTEC) 10 MG tablet Take 1 tablet (10 mg total) by mouth daily. (Patient not taking: Reported on 02/03/2020) 90 tablet 4  . EPINEPHrine (EPIPEN) 0.3 mg/0.3 mL IJ SOAJ injection Inject into the muscle once.    . fluticasone (FLONASE) 50 MCG/ACT nasal spray Place 2 sprays into both nostrils daily as needed for allergies or rhinitis.     Marland Kitchen labetalol (NORMODYNE) 100 MG tablet Take 1 tablet (100 mg total) by mouth 2 (two) times daily. 60 tablet 2  . oxyCODONE-acetaminophen (PERCOCET/ROXICET) 5-325 MG per tablet Take 1-2 tablets by mouth every 6 (six) hours as needed for severe pain. (Patient not taking: Reported on 02/03/2020) 30 tablet 0  . Prenatal Vit-Fe Fumarate-FA (MULTIVITAMIN-PRENATAL) 27-0.8 MG TABS tablet Take 1 tablet by mouth daily at 12 noon.    . sertraline (ZOLOFT) 100 MG tablet Take 150 mg by mouth daily.     Allergies  Allergen Reactions  . Bee Venom Swelling  . Penicillins Hives  . Sulfa Antibiotics Hives    I have reviewed patient's Past Medical Hx, Surgical Hx, Family Hx, Social Hx, medications and allergies.   ROS:  Review of Systems  Constitutional: Negative for chills and fever.  Respiratory: Negative for shortness of breath.    Gastrointestinal: Positive for abdominal pain. Negative for constipation and diarrhea.  Genitourinary: Positive for pelvic pain and vaginal bleeding. Negative for dysuria and frequency.   Review of Systems  Other systems negative   Physical Exam  Physical Exam Patient Vitals for the past 24 hrs:  BP Temp Temp src Pulse Resp SpO2 Height Weight  03/05/20 1215 135/67 97.8 F (36.6 C) Oral 89 16 99 % -- --  03/05/20 1206 -- -- -- -- -- -- '5\' 3"'$  (1.6 m) 116.7 kg   Constitutional: Well-developed, well-nourished female in no acute distress.  Cardiovascular: normal rate Respiratory: normal effort GI: Abd soft, non-tender. Pos BS x 4 MS: Extremities nontender, no edema, normal ROM Neurologic: Alert and oriented x 4.  GU: Neg CVAT.  PELVIC EXAM: bleeding is light.  Internal exam deferred since I am ordering an [place].   LAB RESULTS  Results for orders placed or performed during the hospital encounter of 03/05/20 (from the past 24 hour(s))  Urinalysis, Routine w reflex microscopic     Status: Abnormal   Collection Time: 03/05/20  1:07 PM  Result Value Ref Range   Color, Urine YELLOW YELLOW   APPearance CLOUDY (A) CLEAR   Specific Gravity, Urine 1.013 1.005 - 1.030   pH 5.0 5.0 - 8.0   Glucose, UA NEGATIVE NEGATIVE mg/dL   Hgb urine dipstick LARGE (A) NEGATIVE   Bilirubin Urine NEGATIVE NEGATIVE   Ketones, ur NEGATIVE NEGATIVE mg/dL   Protein, ur 100 (A) NEGATIVE mg/dL   Nitrite NEGATIVE NEGATIVE   Leukocytes,Ua NEGATIVE NEGATIVE   RBC / HPF >50 (H) 0 - 5 RBC/hpf   WBC, UA 11-20 0 - 5 WBC/hpf   Bacteria, UA NONE SEEN NONE SEEN   Squamous Epithelial / LPF 6-10 0 - 5     IMAGING US OB Transvaginal  Result Date: 03/05/2020 CLINICAL DATA:  Spontaneous abortion. EXAM: TRANSVAGINAL OB ULTRASOUND TECHNIQUE: Transvaginal ultrasound was performed for complete evaluation of the gestation as well as the maternal uterus, adnexal regions, and pelvic cul-de-sac. COMPARISON:  None. FINDINGS:  Intrauterine gestational sac: No Yolk sac:  No Embryo:  No Cardiac Activity: No The endometrium is heterogeneous and thickened at 1.4 cm. There is a small amount of fluid in the endometrial cavity. Maternal uterus/adnexae: The ovaries appear normal. The right ovary is 3.0 x 2.8 x 2.0 cm. The left ovary is 2.6 x 2.1 x 1.3 cm. The patient has 2 uterine fibroids, 1 measuring 5 x 4 x 4 cm to the left of midline and a fundal fibroid measuring 1.6 x 1.5 x 1.6 cm. IMPRESSION: Negative for intrauterine pregnancy. Thickened slightly heterogeneous endometrium with a small amount of fluid in the endometrial cavity. Uterine fibroids. Electronically Signed   By: Lorriane Shire M.D.   On: 03/05/2020 14:23    MAU Management/MDM: Ordered followup Ultrasound to rule out SAB [place] did show completion of SAB,  No embryo seen in uterus Reviewed with patient  Patient wants to try to conceive again  ASSESSMENT Pregnancy at 4w4dComplete spontaneous abortion Back pain  PLAN Discharge home Rx ibuprofen for pain Rx Percocet #5tab for severe pain (registry checked, last Rx was a year ago, from pain clinic) Followup in clinic for post SAB visit  Pt stable at time of discharge. Encouraged to return here or to other Urgent Care/ED if she develops worsening of symptoms, increase in pain, fever, or other concerning symptoms.    MHansel FeinsteinCNM, MSN Certified Nurse-Midwife 03/05/2020  12:35 PM

## 2020-03-07 ENCOUNTER — Encounter: Payer: Medicaid Other | Admitting: Obstetrics

## 2020-03-16 ENCOUNTER — Ambulatory Visit (HOSPITAL_COMMUNITY): Payer: Medicaid Other

## 2020-03-21 ENCOUNTER — Ambulatory Visit: Payer: Medicaid Other | Admitting: Obstetrics and Gynecology

## 2020-03-24 ENCOUNTER — Ambulatory Visit (INDEPENDENT_AMBULATORY_CARE_PROVIDER_SITE_OTHER): Payer: Medicaid Other | Admitting: Obstetrics and Gynecology

## 2020-03-24 ENCOUNTER — Other Ambulatory Visit: Payer: Self-pay

## 2020-03-24 ENCOUNTER — Encounter: Payer: Self-pay | Admitting: Obstetrics

## 2020-03-24 ENCOUNTER — Encounter: Payer: Self-pay | Admitting: Obstetrics and Gynecology

## 2020-03-24 VITALS — BP 119/78 | HR 83 | Wt 256.6 lb

## 2020-03-24 DIAGNOSIS — O039 Complete or unspecified spontaneous abortion without complication: Secondary | ICD-10-CM

## 2020-03-24 NOTE — Progress Notes (Signed)
Pt presents for f/u SAB 03/02/20

## 2020-03-24 NOTE — Progress Notes (Signed)
44 yo [id number] diagnosed with a complete miscarriage on 3/27 presenting today as a follow up. Patient reports still feeling emotional about her loss. She is being followed by a psychiatrist and is scheduled to be seen next on 4/19. Patient denies any persistent vaginal bleeding or pelvic pain. She desires to conceive again. Patient reports last 3 miscarriages have been with a new husband. He fathered 2 children one of which passed away.  Past Medical History:  Diagnosis Date  . Anemia   . Anxiety   . Arthritis   . Cyst of eye   . Depression   . Endometriosis of uterus   . Headache   . Hypertension   . Idiopathic neuropathy   . Morbid obesity ([place])   . Neuralgia   . Sickle cell trait [facility])    Past Surgical History:  Procedure Laterality Date  . ANKLE SURGERY Right March 26 2015   seven screws put in left ankle due to a fall  . spinal tap     Family History  Adopted: Yes  Problem Relation Age of Onset  . Stroke Mother 55  . Hypertension Mother   . Hypertension Father   . Diabetes Other   . Glaucoma Other   . Hypertension Other   . Mental illness Sister   . Mental retardation Sister    Social History   Tobacco Use  . Smoking status: Former Smoker    Packs/day: 0.10    Years: 15.00    Pack years: 1.50    Types: Cigarettes  . Smokeless tobacco: Never Used  Substance Use Topics  . Alcohol use: No  . Drug use: No   ROS See pertinent in HPI Blood pressure 119/78, pulse 83, weight 256 lb 9.6 oz (116.4 kg), last menstrual period 12/15/2019.  GENERAL: Well-developed, well-nourished female in no acute distress.  LUNGS: Clear to auscultation bilaterally.  HEART: Regular rate and rhythm. BREASTS: Symmetric in size. No palpable masses or lymphadenopathy, skin changes, or nipple drainage. ABDOMEN: Soft, nontender, nondistended. No organomegaly. PELVIC: Normal external female genitalia. Vagina is pink and rugated.  Normal discharge. Normal appearing cervix. Uterus is normal in  size.  No adnexal mass or tenderness. EXTREMITIES: No cyanosis, clubbing, or edema, 2+ distal pulses.  A/P 44 yo with recent miscarriage - Emotional support provided - Encouraged patient to continue prenatal vitamins - Patient referred to genetic counseling

## 2020-04-26 IMAGING — US US OB TRANSVAGINAL
1 series · 15 of 28 positions shown · non-contrast
Comparison: None.

CLINICAL DATA: Spontaneous abortion.

EXAM:
TRANSVAGINAL OB ULTRASOUND
TECHNIQUE: Transvaginal ultrasound was performed for complete evaluation of the
gestation as well as the maternal uterus, adnexal regions, and
pelvic cul-de-sac.

[Series 1: us ob transvaginal · 15 of 33 slices shown]
[im 1/33]
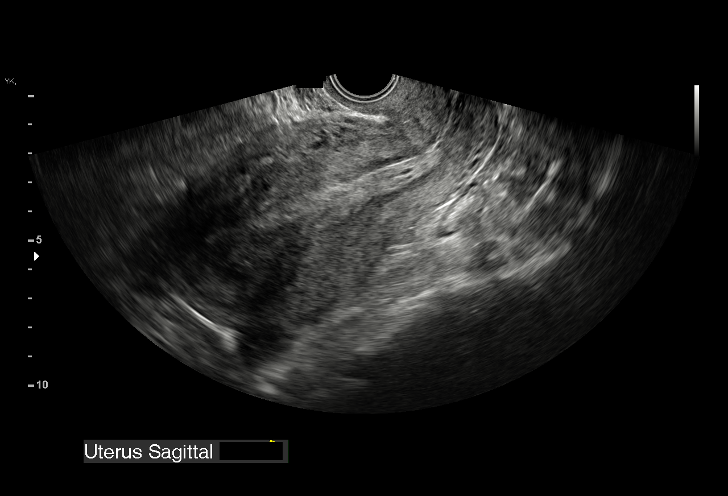
[im 3/33]
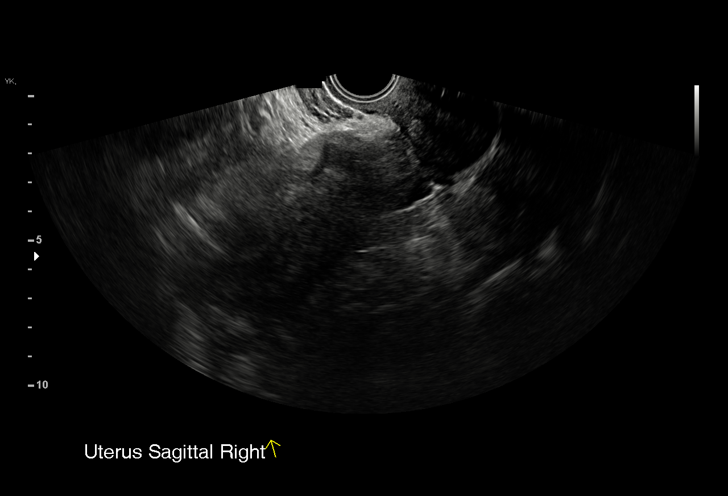
[im 5/33]
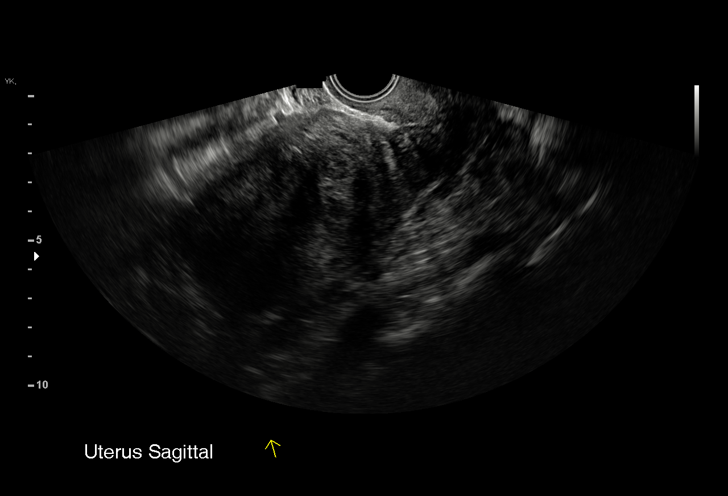
[im 8/33]
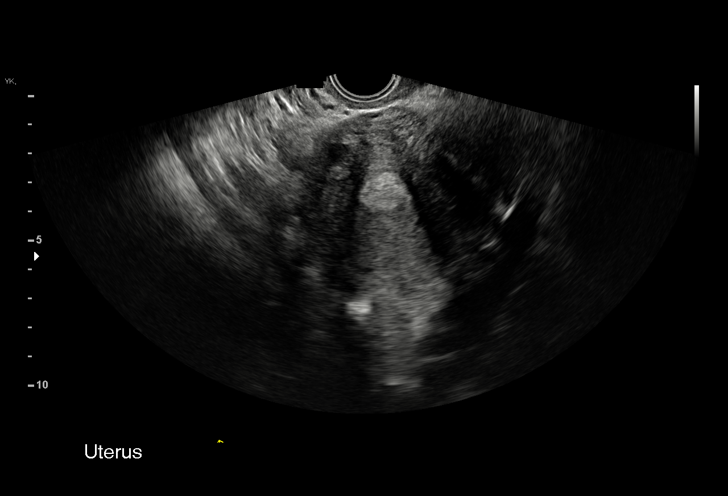
[im 10/33]
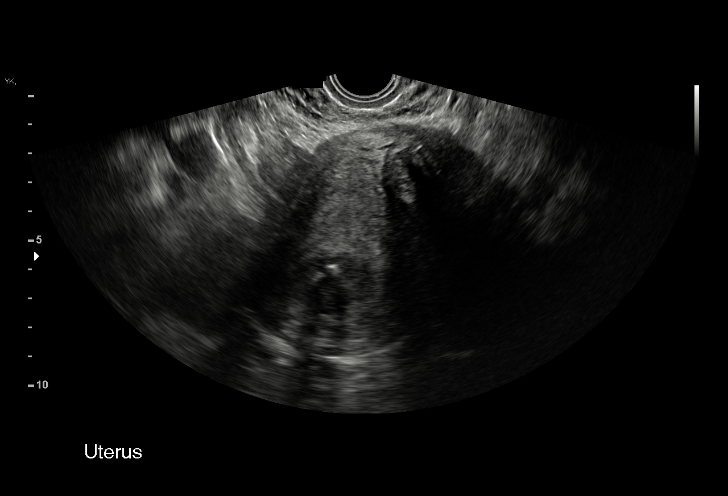
[im 12/33]
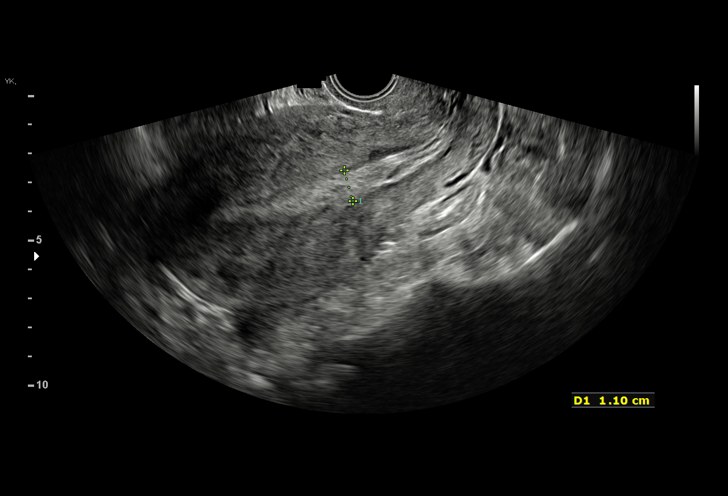
[im 15/33]
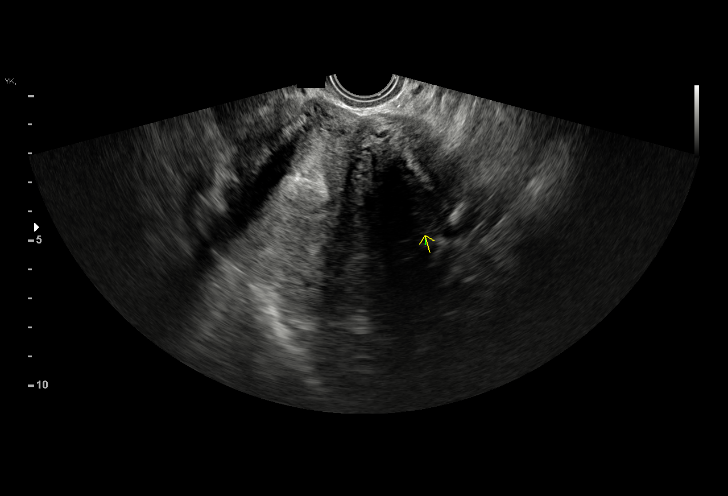
[im 17/33]
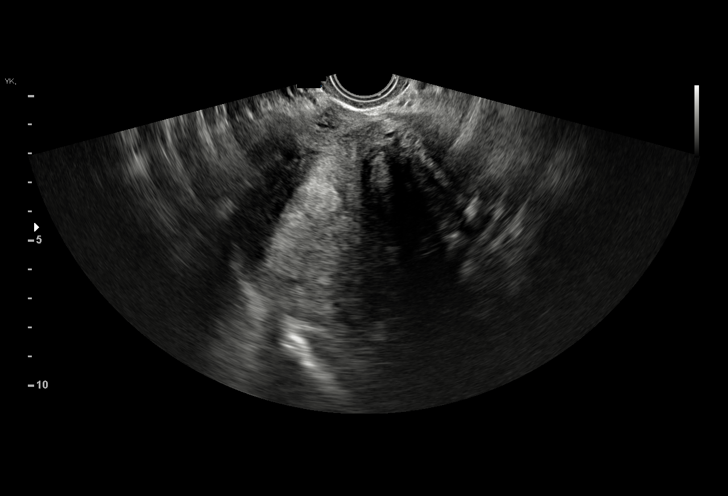
[im 18/33]
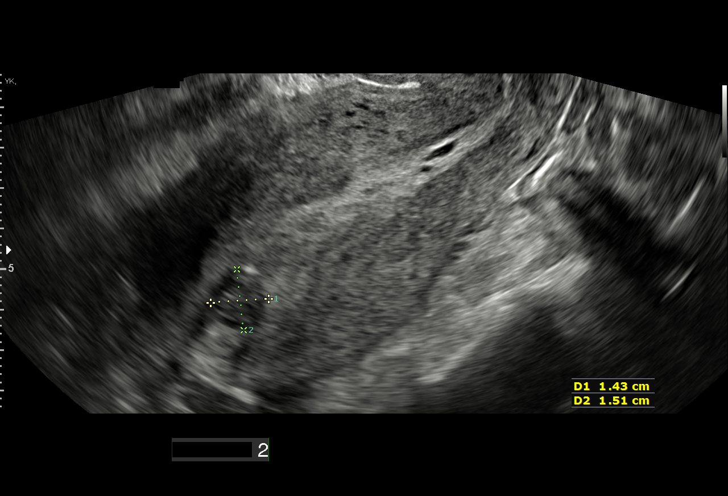
[im 21/33]
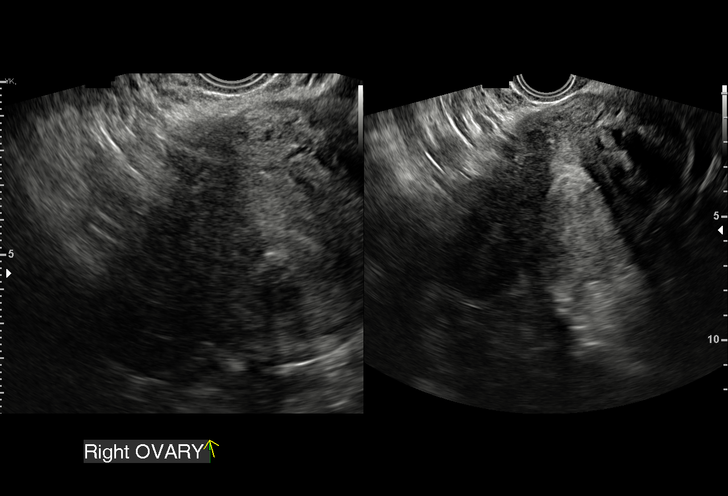
[im 23/33]
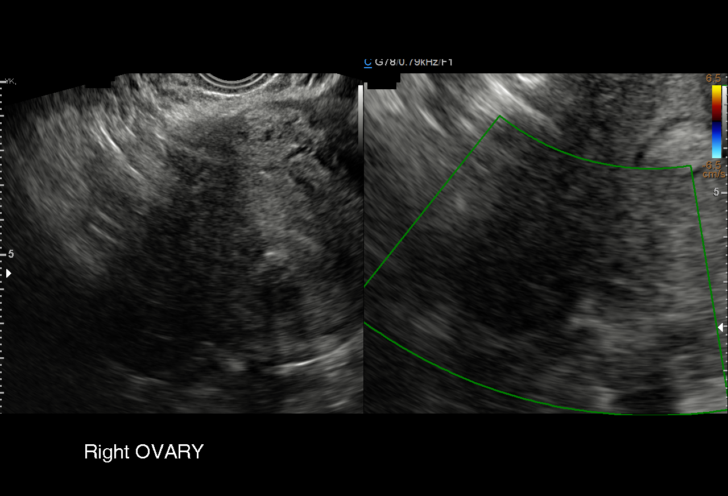
[im 25/33]
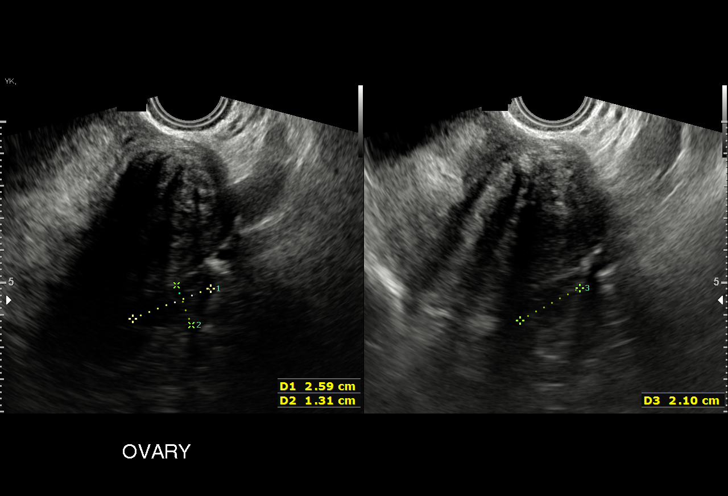
[im 28/33]
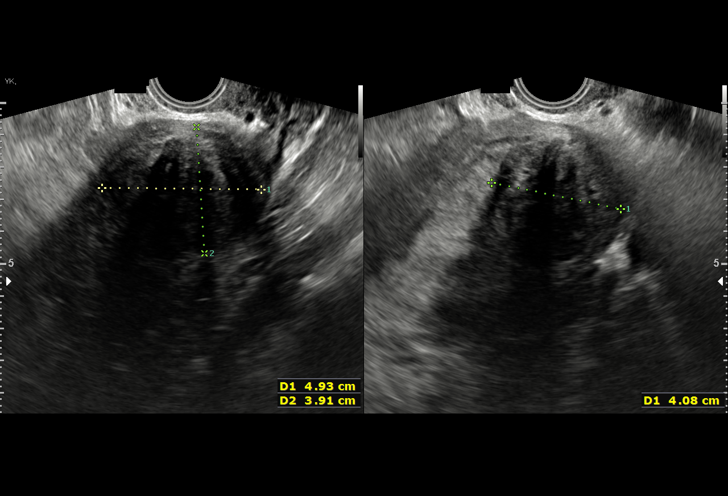
[im 30/33]
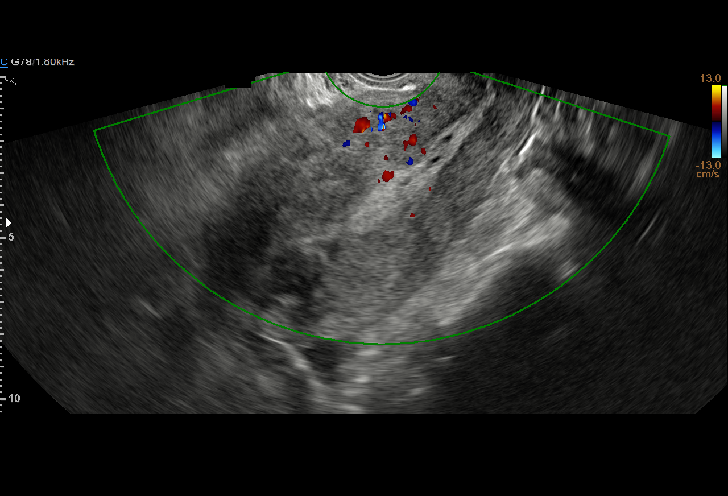
[im 33/33]
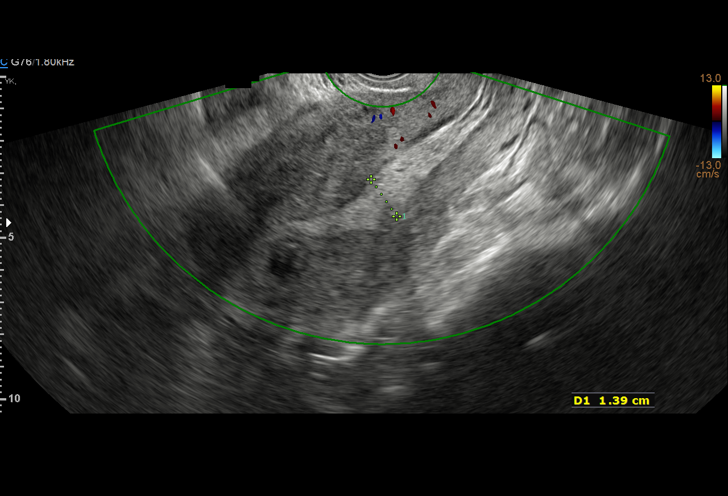

[15 of 28 positions shown; findings below may reference images not displayed]

FINDINGS: Intrauterine gestational sac: No

Yolk sac:  No

Embryo:  No

Cardiac Activity: No

The endometrium is heterogeneous and thickened at 1.4 cm. There is a
small amount of fluid in the endometrial cavity.

Maternal uterus/adnexae: The ovaries appear normal. The right ovary
is 3.0 x 2.8 x 2.0 cm. The left ovary is 2.6 x 2.1 x 1.3 cm.

The patient has 2 uterine fibroids, 1 measuring 5 x 4 x 4 cm to the
left of midline and a fundal fibroid measuring 1.6 x 1.5 x 1.6 cm.
IMPRESSION: Negative for intrauterine pregnancy. Thickened slightly
heterogeneous endometrium with a small amount of fluid in the
endometrial cavity.

Uterine fibroids.

## 2020-09-03 ENCOUNTER — Other Ambulatory Visit: Payer: Medicaid Other

## 2020-09-03 ENCOUNTER — Other Ambulatory Visit: Payer: Self-pay

## 2020-09-03 ENCOUNTER — Other Ambulatory Visit: Payer: Self-pay | Admitting: Internal Medicine

## 2020-09-03 DIAGNOSIS — Z20822 Contact with and (suspected) exposure to covid-19: Secondary | ICD-10-CM

## 2020-09-04 LAB — NOVEL CORONAVIRUS, NAA: SARS-CoV-2, NAA: NOT DETECTED

## 2020-09-04 LAB — SARS-COV-2, NAA 2 DAY TAT

## 2021-04-07 ENCOUNTER — Encounter: Payer: Self-pay | Admitting: Obstetrics and Gynecology

## 2021-04-07 ENCOUNTER — Other Ambulatory Visit: Payer: Self-pay

## 2021-04-07 ENCOUNTER — Ambulatory Visit (INDEPENDENT_AMBULATORY_CARE_PROVIDER_SITE_OTHER): Payer: Medicaid Other

## 2021-04-07 DIAGNOSIS — N926 Irregular menstruation, unspecified: Secondary | ICD-10-CM

## 2021-04-07 LAB — POCT URINE PREGNANCY: Preg Test, Ur: NEGATIVE

## 2021-04-07 NOTE — Progress Notes (Signed)
Chart reviewed for nurse visit. Agree with plan of care.   Luvenia Redden, PA-C 04/07/2021 12:00 PM

## 2021-04-07 NOTE — Progress Notes (Signed)
Sheri Roberts presents today for UPT. Pt is trying to conceive. She has no unusual complaints.  LMP: 02/16/21    OBJECTIVE: Appears well, in no apparent distress.  OB History    Gravida  7   Para  4   Term  4   Preterm      AB  3   Living  4     SAB  3   IAB      Ectopic      Multiple      Live Births  4          Home UPT Result: Negative In-Office UPT result: Negative I have reviewed the patient's medical, obstetrical, social, and family histories, and medications.   ASSESSMENT: Negative pregnancy test  PLAN Repeat home UPT in 2 weeks if positive, contact the office for an appt

## 2021-06-07 ENCOUNTER — Ambulatory Visit: Payer: Medicaid Other | Attending: Physician Assistant

## 2021-06-07 ENCOUNTER — Other Ambulatory Visit: Payer: Self-pay

## 2021-06-07 DIAGNOSIS — M5441 Lumbago with sciatica, right side: Secondary | ICD-10-CM | POA: Insufficient documentation

## 2021-06-07 DIAGNOSIS — M6281 Muscle weakness (generalized): Secondary | ICD-10-CM | POA: Diagnosis present

## 2021-06-07 DIAGNOSIS — G8929 Other chronic pain: Secondary | ICD-10-CM

## 2021-06-07 NOTE — Patient Instructions (Signed)
  [id number]

## 2021-06-07 NOTE — Therapy (Signed)
[place], [place], [address] Phone: [phone]   Fax:  [phone]  Physical Therapy Evaluation  Patient Details  Name: Sheri Roberts MRN: [id number] Date of Birth: 04/11/1976 Referring Provider (PT): [place], [place]   Encounter Date: 06/07/2021   PT End of Session - 06/07/21 1756     Visit Number 1    Number of Visits 8    Date for PT Re-Evaluation 08/02/21    Authorization Type [place] MCD - pending authorization    Authorization Time Period Pending auth    PT Start Time 1700    PT Stop Time 1750    PT Time Calculation (min) 50 min    Activity Tolerance Patient tolerated treatment well    Behavior During Therapy [facility] for tasks assessed/performed             Past Medical History:  Diagnosis Date   Anemia    Anxiety    Arthritis    Cyst of eye    Depression    Endometriosis of uterus    Headache    Hypertension    Idiopathic neuropathy    Morbid obesity ([place])    Neuralgia    Sickle cell trait [facility])     Past Surgical History:  Procedure Laterality Date   ANKLE SURGERY Right March 26 2015   seven screws put in left ankle due to a fall   spinal tap      There were no vitals filed for this visit.    Subjective Assessment - 06/07/21 1708     Subjective Pt reports to PT with primary c/o chronic low back pain with spondylosis. She states her referring provider suggested aquatic therapy for her pain. 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 She felt immediate pain at the time and reports that the pain has gotten worse ever since. Pt reports having N/T down her R LE preceded by exacerbation of LBP. The N/T and pain is present more at night than in the day. Sitting is painful for the pt. Pain medication helps the pain, but the pain is always present. She adds that she has sleep disturbance due to pain and also has sleep  apnea. Pt states she may pursue aquatic therapy in the future, but not at this time.    Limitations Sitting;House hold activities    How long can you sit comfortably? 15-20 minutes, LBP    How long can you stand comfortably? 45 minutes, leg N/T    How long can you walk comfortably? 45 minutes, leg N/T    Diagnostic tests Pt had lumbar MRI performed earlier this year; results not in EPIC; Pt states she will request release of the reports to the clinic    Patient Stated Goals Decreased lumbar pain, sleep better at night, do activities with children    Currently in Pain? Yes    Pain Score 8     Pain Location Back    Pain Orientation Lower;Right    Pain Descriptors / Indicators Aching;Throbbing;Numbness;Tingling    Pain Type Chronic pain    Pain Radiating Towards R LE    Pain Onset More than a month ago    Pain Frequency Constant    Aggravating Factors  Prolonged sitting, household chores    Pain Relieving Factors pain medication, Lidocaine/ voltaren gel    Effect of Pain on Daily Activities Pt unable  to do many activities with her children, sleep well, or perform household chores without pain                [facility] PT Assessment - 06/07/21 0001       Assessment   Medical Diagnosis Spondylosis without myelopathy or radiculopathy, 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  No    Is the patient reluctant to leave their home because of a fear of falling?  No      Home Social worker Private residence    Living Arrangements Spouse/significant other;Children     Available Help at Discharge Family    Type of [place] to enter    Entrance Stairs-Number of Steps 1    Entrance Stairs-Rails Right;Left;Can reach both;Cannot reach both    Home Layout Two level    Alternate Level Stairs-Number of Steps 15    Alternate Level Stairs-Rails Right;Can reach both    [employer] - single point      Prior Function   Level of Independence Independent    Vocation Full time employment    Vocation Requirements Daily ADL's, bathing, dressing patients    Leisure Traveling      Cognition   Overall Cognitive Status Within Functional Limits for tasks assessed      Observation/Other Assessments   Observations Pt demonstrates increase lumbar lordosis in standing and sitting      Other:   Other/ Comments Pt unable to perform heel raise      AROM   Lumbar Flexion 40 p!    Lumbar Extension 10 p!    Lumbar - Right Side Bend 13cm p!    Lumbar - Left Side Bend 8cm p!    Lumbar - Right Rotation 25 p!    Lumbar - Left Rotation 40 p!      Strength   Right Hip Flexion 4/5    Right Hip Extension 3-/5    Right Hip ABduction 3-/5    Left Hip Flexion 4+/5    Left Hip Extension 3-/5    Left Hip ABduction 3-/5    Right Knee Flexion 4/5    Right Knee Extension 4/5    Left Knee Flexion 5/5    Left Knee Extension 5/5    Right Ankle Dorsiflexion 4+/5    Right Ankle Plantar Flexion 3/5    Left Ankle Dorsiflexion 4+/5    Left Ankle Plantar Flexion 3/5      Palpation   Palpation comment Pt TTP to BIL lumbar paraspinals; CPA's to T10-L5 painful and hypermobile      Special Tests    Special Tests Hip Special Tests    Hip Special Tests  [facility] Test    Findings Positive    Side Right;Left  Objective measurements completed on examination: See above findings.               PT Education - 06/07/21 1756     Education Details Pt educated regarding potential underlying  physiology to her pain, as well as proper form when performing HEP.    Person(s) Educated Patient    Methods Explanation;Demonstration;Handout    Comprehension Returned demonstration;Verbalized understanding              PT Short Term Goals - 06/07/21 1809       PT SHORT TERM GOAL #1   Title Pt will report understanding and adherence to her HEP in order to establish independence in the management of her symptoms.    Baseline HEP given at baseline    Time 4    Period Weeks    Status New    Target Date 07/05/21               PT Long Term Goals - 06/07/21 1810       PT LONG TERM GOAL #1   Title Pt will report leg pain or N/T of <2 days per week in order to indicate centralization of peripheralized pain.    Baseline Pt experiences R leg pain and N/T daily.    Time 8    Period Weeks    Status New    Target Date 08/02/21      PT LONG TERM GOAL #2   Title Pt will demonstrate 60 degrees or more of lumbar flexion AROM with 4/10 pain or less in order to get dressed without difficulty.    Baseline 40 degrees of lumbar flexion AROM with 8/10 pain    Time 8    Period Weeks    Status New    Target Date 08/02/21      PT LONG TERM GOAL #3   Title Pt will demonstrate BIL hip global MMT of 4+/5 or greater in order to progress LE strengthening regimen without limitation.    Baseline BIL hip extension and abduction = 3-/5    Time 8    Period Weeks    Status New    Target Date 08/02/21      PT LONG TERM GOAL #4   Title Pt will demonstrate BIL lumbar rotation of 60 degrees or greater with <5/10 pain in order to unload laundry or dishwasher without difficulty.    Baseline L lumbar rotation = 25 degrees with 8/10 pain; R lumbar rotation = 40 degrees with 8/10 pain    Time 8    Period Weeks    Status New    Target Date 08/02/21      PT LONG TERM GOAL #5   Title Pt will report ability to sleep through the night without limitation due to pain.    Baseline Pt regularly woken by  pain.    Time 8    Period Weeks    Status New    Target Date 08/02/21                    Plan - 06/07/21 1758     Clinical Impression Statement Pt is a pleasant 45yo F presenting to PT with primary c/o chronic LBP with R sided radicular symptoms lasting about 27 years. Upon assessment, her primary impairments include weak global R LE musculature, painful and limited AROM in all planes, painful and hypermobile lumbar passive accessories, tight hip flexors BIL, and increased lumbar lordosis. Pt's sxs are  indicative of lumbar instability, although plank testing and PIT testing may assist in ruling in this condition. Pt may also have lumbar discogenic dysfunction as is indicated by recent reports, although quadrant/ distraction testing and an established directional preference may help to rule in this condition. Pt reports she will request release of recent lumbar MRI for viewing by PT to determine any concordant imaging correlation to clinical findings. The pt will benefit from skilled PT to address her primary impairments and help her return to her prior level of function without limitation.    Personal Factors and Comorbidities Comorbidity 3+    Comorbidities See medical Hx above.    Examination-Activity Limitations Dressing;Sit;Squat;Bend;Lift    Examination-Participation Restrictions Interpersonal Relationship;Occupation;Laundry;Cleaning;Community Activity    Stability/Clinical Decision Making Stable/Uncomplicated    Clinical Decision Making Low    Rehab Potential Fair   Due to chronicity of sxs lasting 27 years   PT Frequency 1x / week    PT Duration 8 weeks    PT Treatment/Interventions ADLs/Self Care Home Management;Aquatic Therapy;Biofeedback;Moist Heat;Electrical Stimulation;Cryotherapy;Traction;DME Instruction;Therapeutic activities;Functional mobility training;Stair training;Gait training;Therapeutic exercise;Balance training;Neuromuscular re-education;Cognitive  remediation;Taping;Patient/family education;Manual techniques;Passive range of motion;Spinal Manipulations;Joint Manipulations;Dry needling    PT Next Visit Plan Assess plank test, PIT test, quadrant test, distraction, attempt to establish directional preference, introduce hip/ core strengthening    PT Home Exercise Plan [place] and Agree with Plan of Care Patient             Patient will benefit from skilled therapeutic intervention in order to improve the following deficits and impairments:  Abnormal gait, Decreased range of motion, Decreased activity tolerance, Decreased strength, Hypermobility, Impaired flexibility, Postural dysfunction, Pain  Visit Diagnosis: Chronic bilateral low back pain with right-sided sciatica  Muscle weakness (generalized)     Problem List Patient Active Problem List   Diagnosis Date Noted   Chronic hypertension affecting pregnancy 03/02/2020   Depression affecting pregnancy 03/02/2020   Maternal morbid obesity, antepartum ([place]) 03/02/2020   Supervision of high-risk pregnancy 02/03/2020   Anxiety and depression 12/26/2017   Chronic pain 12/26/2017   Absolute anemia 12/25/2017   Chronic migraine without aura, not intractable, without status migrainosus 10/07/2017   Hypertensive retinopathy of both eyes 06/26/2016   Nuclear sclerotic cataract of both eyes 06/26/2016   Moderate episode of recurrent major depressive disorder ([place]) 06/14/2015   Allergic rhinitis 09/21/2014   Peripheral neuropathic pain 08/10/2014   Benign essential HTN 08/04/2014   Morbid obesity ([place]) 08/04/2014   Sickle-cell trait ([place]) 08/04/2014   Adenomyosis 08/04/2014   Chronic pelvic pain in female 06/07/2014    Vanessa Whipholt, PT, DPT 06/07/21 [facility] [address] [place], [place], [address] Phone: [phone]   Fax:  [phone]  Name: Sheri Roberts MRN: [id number] Date of Birth:  12-17-1975

## 2021-06-08 ENCOUNTER — Ambulatory Visit: Payer: Medicaid Other

## 2021-06-09 ENCOUNTER — Ambulatory Visit: Payer: Medicaid Other | Admitting: Physical Therapy

## 2021-06-28 ENCOUNTER — Other Ambulatory Visit: Payer: Self-pay

## 2021-06-28 ENCOUNTER — Ambulatory Visit: Payer: Medicaid Other | Attending: Physician Assistant

## 2021-06-28 DIAGNOSIS — M6281 Muscle weakness (generalized): Secondary | ICD-10-CM | POA: Diagnosis present

## 2021-06-28 DIAGNOSIS — G8929 Other chronic pain: Secondary | ICD-10-CM | POA: Insufficient documentation

## 2021-06-28 DIAGNOSIS — M5441 Lumbago with sciatica, right side: Secondary | ICD-10-CM | POA: Diagnosis present

## 2021-06-28 NOTE — Therapy (Signed)
[place], [place], [address] Phone: [phone]   Fax:  [phone]  Physical Therapy Treatment  Patient Details  Name: Sheri Roberts MRN: [id number] Date of Birth: 03/22/1976 Referring Provider (PT): [place], [place]   Encounter Date: 06/28/2021   PT End of Session - 06/28/21 1815     Visit Number 2    Number of Visits 8    Date for PT Re-Evaluation 08/02/21    Authorization Type [place] MCD    Authorization Time Period 12 visits from 06/28/2021-08/27/2021    Authorization - Visit Number 1    Authorization - Number of Visits 12    PT Start Time 7902    PT Stop Time 4097    PT Time Calculation (min) 45 min    Activity Tolerance Patient tolerated treatment well    Behavior During Therapy [facility] for tasks assessed/performed             Past Medical History:  Diagnosis Date   Anemia    Anxiety    Arthritis    Cyst of eye    Depression    Endometriosis of uterus    Headache    Hypertension    Idiopathic neuropathy    Morbid obesity ([place])    Neuralgia    Sickle cell trait ([place])     Past Surgical History:  Procedure Laterality Date   ANKLE SURGERY Right March 26 2015   seven screws put in left ankle due to a fall   spinal tap      There were no vitals filed for this visit.   Subjective Assessment - 06/28/21 1747     Subjective Pt reports continued severe LBP, as well as R ankle pain that she reports is due to breaking and dislocating her ankle several years ago. She reports varied adherence to her HEP, performing exercises some days, but not all.    Currently in Pain? Yes    Pain Score 8     Pain Location Back    Pain Orientation Lower;Right    Pain Descriptors / Indicators Aching;Throbbing    Pain Type Chronic pain    Pain Onset More than a month ago    Pain Frequency Constant                [facility] PT Assessment - 06/28/21 0001       other   Findings Positive    Comments  PLE      other   Findings Positive    Comment Plank testing, 12seconds, terminated due to pain                           [facility] Adult PT Treatment/Exercise - 06/28/21 0001       Lumbar Exercises: Standing   Other Standing Lumbar Exercises Pallof press with GTB 2x10 BIL      Lumbar Exercises: Supine   Bent Knee Raise --   4x to exhaustion   Bridge with clamshell --   2x10 with RTB   Other Supine Lumbar Exercises Curl up with alternating heel taps 2x10 BIL      Modalities   Modalities Moist Heat      Moist Heat Therapy   Number Minutes Moist Heat 10 Minutes    Moist Heat Location Lumbar Spine      Manual Therapy   Manual Therapy Soft tissue mobilization    Soft tissue mobilization Efflourage to BIL  lumbar paraspinals/ QL                    PT Education - 06/28/21 1815     Education Details Pt educated on proper form when performing new exercises.    Person(s) Educated Patient    Methods Explanation;Demonstration;Handout;Verbal cues    Comprehension Verbalized understanding;Returned demonstration;Verbal cues required              PT Short Term Goals - 06/07/21 1809       PT SHORT TERM GOAL #1   Title Pt will report understanding and adherence to her HEP in order to establish independence in the management of her symptoms.    Baseline HEP given at baseline    Time 4    Period Weeks    Status New    Target Date 07/05/21               PT Long Term Goals - 06/07/21 1810       PT LONG TERM GOAL #1   Title Pt will report leg pain or N/T of <2 days per week in order to indicate centralization of peripheralized pain.    Baseline Pt experiences R leg pain and N/T daily.    Time 8    Period Weeks    Status New    Target Date 08/02/21      PT LONG TERM GOAL #2   Title Pt will demonstrate 60 degrees or more of lumbar flexion AROM with 4/10 pain or less in order to get dressed without difficulty.    Baseline 40 degrees of lumbar  flexion AROM with 8/10 pain    Time 8    Period Weeks    Status New    Target Date 08/02/21      PT LONG TERM GOAL #3   Title Pt will demonstrate BIL hip global MMT of 4+/5 or greater in order to progress LE strengthening regimen without limitation.    Baseline BIL hip extension and abduction = 3-/5    Time 8    Period Weeks    Status New    Target Date 08/02/21      PT LONG TERM GOAL #4   Title Pt will demonstrate BIL lumbar rotation of 60 degrees or greater with <5/10 pain in order to unload laundry or dishwasher without difficulty.    Baseline L lumbar rotation = 25 degrees with 8/10 pain; R lumbar rotation = 40 degrees with 8/10 pain    Time 8    Period Weeks    Status New    Target Date 08/02/21      PT LONG TERM GOAL #5   Title Pt will report ability to sleep through the night without limitation due to pain.    Baseline Pt regularly woken by pain.    Time 8    Period Weeks    Status New    Target Date 08/02/21                   Plan - 06/28/21 1817     Clinical Impression Statement Pt responded well to all treatment today with no increase in pain with new exercises. She required regular verbal cuing to correct for form when performing core exercises today. Her positive PLE and plank testing today increases likelihood of lumbar instability, offering credence to the need of continued core strengthening. She will benefit from skilled PT to address her primary impairments and return to her  prior level of function with less limitation.    Personal Factors and Comorbidities Comorbidity 3+    Comorbidities See medical Hx above.    Examination-Activity Limitations Dressing;Sit;Squat;Bend;Lift    Examination-Participation Restrictions Interpersonal Relationship;Occupation;Laundry;Cleaning;Community Activity    Stability/Clinical Decision Making Stable/Uncomplicated    Clinical Decision Making Low    Rehab Potential Fair   Due to chronicity of sxs lasting 39yrs   PT  Frequency 1x / week    PT Duration 8 weeks    PT Treatment/Interventions ADLs/Self Care Home Management;Aquatic Therapy;Biofeedback;Moist Heat;Electrical Stimulation;Cryotherapy;Traction;DME Instruction;Therapeutic activities;Functional mobility training;Stair training;Gait training;Therapeutic exercise;Balance training;Neuromuscular re-education;Cognitive remediation;Taping;Patient/family education;Manual techniques;Passive range of motion;Spinal Manipulations;Joint Manipulations;Dry needling    PT Next Visit Plan progress core/ hip strengthening    PT Home Exercise Plan [place] and Agree with Plan of Care Patient             Patient will benefit from skilled therapeutic intervention in order to improve the following deficits and impairments:  Abnormal gait, Decreased range of motion, Decreased activity tolerance, Decreased strength, Hypermobility, Impaired flexibility, Postural dysfunction, Pain  Visit Diagnosis: Chronic bilateral low back pain with right-sided sciatica  Muscle weakness (generalized)     Problem List Patient Active Problem List   Diagnosis Date Noted   Chronic hypertension affecting pregnancy 03/02/2020   Depression affecting pregnancy 03/02/2020   Maternal morbid obesity, antepartum ([place]) 03/02/2020   Supervision of high-risk pregnancy 02/03/2020   Anxiety and depression 12/26/2017   Chronic pain 12/26/2017   Absolute anemia 12/25/2017   Chronic migraine without aura, not intractable, without status migrainosus 10/07/2017   Hypertensive retinopathy of both eyes 06/26/2016   Nuclear sclerotic cataract of both eyes 06/26/2016   Moderate episode of recurrent major depressive disorder ([place]) 06/14/2015   Allergic rhinitis 09/21/2014   Peripheral neuropathic pain 08/10/2014   Benign essential HTN 08/04/2014   Morbid obesity ([place]) 08/04/2014   Sickle-cell trait ([place]) 08/04/2014   Adenomyosis 08/04/2014   Chronic pelvic pain in female 06/07/2014     Vanessa Strafford, PT, DPT 06/28/21 6:29 PM   [place] [facility] [address] [place], [place], [address] Phone: [phone]   Fax:  [phone]  Name: Sheri Roberts MRN: [id number] Date of Birth: May 02, 1976

## 2021-06-28 NOTE — Patient Instructions (Signed)
  [id number]

## 2021-07-05 ENCOUNTER — Other Ambulatory Visit: Payer: Self-pay

## 2021-07-05 ENCOUNTER — Ambulatory Visit: Payer: Medicaid Other

## 2021-07-05 DIAGNOSIS — G8929 Other chronic pain: Secondary | ICD-10-CM

## 2021-07-05 DIAGNOSIS — M6281 Muscle weakness (generalized): Secondary | ICD-10-CM

## 2021-07-05 DIAGNOSIS — M5441 Lumbago with sciatica, right side: Secondary | ICD-10-CM | POA: Diagnosis not present

## 2021-07-05 NOTE — Therapy (Signed)
[place], [place], [address] Phone: [phone]   Fax:  [phone]  Physical Therapy Treatment  Patient Details  Name: Sheri Roberts MRN: [id number] Date of Birth: 05-19-1976 Referring Provider (PT): [place], [place]   Encounter Date: 07/05/2021   PT End of Session - 07/05/21 1811     Visit Number 3    Number of Visits 8    Date for PT Re-Evaluation 08/02/21    Authorization Type [place] MCD    Authorization Time Period 12 visits from 06/28/2021-08/27/2021    Authorization - Visit Number 2    Authorization - Number of Visits 12    PT Start Time [id number]    PT Stop Time 1830    PT Time Calculation (min) 45 min    Activity Tolerance Patient tolerated treatment well    Behavior During Therapy [facility] for tasks assessed/performed             Past Medical History:  Diagnosis Date   Anemia    Anxiety    Arthritis    Cyst of eye    Depression    Endometriosis of uterus    Headache    Hypertension    Idiopathic neuropathy    Morbid obesity ([place])    Neuralgia    Sickle cell trait ([place])     Past Surgical History:  Procedure Laterality Date   ANKLE SURGERY Right March 26 2015   seven screws put in left ankle due to a fall   spinal tap      There were no vitals filed for this visit.   Subjective Assessment - 07/05/21 1743     Subjective Pt reports feeling slightly better since starting PT, but she still reports severe LBP. She reports performing her HEP about every other day and adds that she has no pain or excessive difficulty with any of her exercises.    Currently in Pain? Yes    Pain Score 8     Pain Location Back    Pain Orientation Right;Lower    Pain Descriptors / Indicators Aching                               [facility] Adult PT Treatment/Exercise - 07/05/21 0001       Lumbar Exercises: Stretches   Press Ups Other (comment)   2x10     Lumbar Exercises: Standing    Other Standing Lumbar Exercises Trunk side bend at cable machine 10#      Lumbar Exercises: Prone   Other Prone Lumbar Exercises Knee plank 3x to exhaustion      Lumbar Exercises: Quadruped   Other Quadruped Lumbar Exercises Bird dog 2x10 BIL      Modalities   Modalities Moist Heat      Moist Heat Therapy   Number Minutes Moist Heat 10 Minutes    Moist Heat Location Lumbar Spine      Manual Therapy   Manual Therapy Soft tissue mobilization    Soft tissue mobilization Efflourage to BIL lumbar paraspinals/ QL                    PT Education - 07/05/21 1759     Education Details Pt educated on proper form when performing new exercises.    Person(s) Educated Patient    Methods Explanation;Demonstration;Handout;Verbal cues    Comprehension Returned demonstration;Verbal cues required;Verbalized understanding  PT Short Term Goals - 06/07/21 1809       PT SHORT TERM GOAL #1   Title Pt will report understanding and adherence to her HEP in order to establish independence in the management of her symptoms.    Baseline HEP given at baseline    Time 4    Period Weeks    Status New    Target Date 07/05/21               PT Long Term Goals - 06/07/21 1810       PT LONG TERM GOAL #1   Title Pt will report leg pain or N/T of <2 days per week in order to indicate centralization of peripheralized pain.    Baseline Pt experiences R leg pain and N/T daily.    Time 8    Period Weeks    Status New    Target Date 08/02/21      PT LONG TERM GOAL #2   Title Pt will demonstrate 60 degrees or more of lumbar flexion AROM with 4/10 pain or less in order to get dressed without difficulty.    Baseline 40 degrees of lumbar flexion AROM with 8/10 pain    Time 8    Period Weeks    Status New    Target Date 08/02/21      PT LONG TERM GOAL #3   Title Pt will demonstrate BIL hip global MMT of 4+/5 or greater in order to progress LE strengthening regimen without  limitation.    Baseline BIL hip extension and abduction = 3-/5    Time 8    Period Weeks    Status New    Target Date 08/02/21      PT LONG TERM GOAL #4   Title Pt will demonstrate BIL lumbar rotation of 60 degrees or greater with <5/10 pain in order to unload laundry or dishwasher without difficulty.    Baseline L lumbar rotation = 25 degrees with 8/10 pain; R lumbar rotation = 40 degrees with 8/10 pain    Time 8    Period Weeks    Status New    Target Date 08/02/21      PT LONG TERM GOAL #5   Title Pt will report ability to sleep through the night without limitation due to pain.    Baseline Pt regularly woken by pain.    Time 8    Period Weeks    Status New    Target Date 08/02/21                   Plan - 07/05/21 1811     Clinical Impression Statement Pt responded well to all treatment today with no increase in pain with new exercises. She required regular verbal cuing to correct for form when performing trunk side bend strengthening exercise today. She reports a therapeutic response to moist heat and manual therapy, reporting a decrease in pain from 8/10 to 6/10 following these interventions. She will benefit from skilled PT to address her primary impairments and return to her prior level of function with less limitation.    Personal Factors and Comorbidities Comorbidity 3+    Comorbidities See medical Hx above.    Examination-Activity Limitations Dressing;Sit;Squat;Bend;Lift    Examination-Participation Restrictions Interpersonal Relationship;Occupation;Laundry;Cleaning;Community Activity    Stability/Clinical Decision Making Stable/Uncomplicated    Clinical Decision Making Low    Rehab Potential Fair    PT Frequency 1x / week    PT Duration  8 weeks    PT Treatment/Interventions ADLs/Self Care Home Management;Aquatic Therapy;Biofeedback;Moist Heat;Electrical Stimulation;Cryotherapy;Traction;DME Instruction;Therapeutic activities;Functional mobility training;Stair  training;Gait training;Therapeutic exercise;Balance training;Neuromuscular re-education;Cognitive remediation;Taping;Patient/family education;Manual techniques;Passive range of motion;Spinal Manipulations;Joint Manipulations;Dry needling    PT Next Visit Plan progress core/ hip strengthening, trial mechanical lumbar traction per pt request    PT Home Exercise Plan [place] and Agree with Plan of Care Patient             Patient will benefit from skilled therapeutic intervention in order to improve the following deficits and impairments:  Abnormal gait, Decreased range of motion, Decreased activity tolerance, Decreased strength, Hypermobility, Impaired flexibility, Postural dysfunction, Pain  Visit Diagnosis: Chronic bilateral low back pain with right-sided sciatica  Muscle weakness (generalized)     Problem List Patient Active Problem List   Diagnosis Date Noted   Chronic hypertension affecting pregnancy 03/02/2020   Depression affecting pregnancy 03/02/2020   Maternal morbid obesity, antepartum ([place]) 03/02/2020   Supervision of high-risk pregnancy 02/03/2020   Anxiety and depression 12/26/2017   Chronic pain 12/26/2017   Absolute anemia 12/25/2017   Chronic migraine without aura, not intractable, without status migrainosus 10/07/2017   Hypertensive retinopathy of both eyes 06/26/2016   Nuclear sclerotic cataract of both eyes 06/26/2016   Moderate episode of recurrent major depressive disorder ([place]) 06/14/2015   Allergic rhinitis 09/21/2014   Peripheral neuropathic pain 08/10/2014   Benign essential HTN 08/04/2014   Morbid obesity ([place]) 08/04/2014   Sickle-cell trait ([place]) 08/04/2014   Adenomyosis 08/04/2014   Chronic pelvic pain in female 06/07/2014    Sheri Roberts 07/05/2021, 6:22 PM  [facility] [address] [place], [place], [address] Phone: [phone]   Fax:  [phone]  Name: Sheri Roberts MRN: [id number] Date of Birth: 1976-06-30

## 2021-07-05 NOTE — Patient Instructions (Signed)
  [id number]

## 2021-07-12 ENCOUNTER — Other Ambulatory Visit: Payer: Self-pay

## 2021-07-12 ENCOUNTER — Ambulatory Visit: Payer: Medicaid Other | Attending: Physician Assistant

## 2021-07-12 DIAGNOSIS — G8929 Other chronic pain: Secondary | ICD-10-CM | POA: Diagnosis present

## 2021-07-12 DIAGNOSIS — M6281 Muscle weakness (generalized): Secondary | ICD-10-CM | POA: Diagnosis present

## 2021-07-12 DIAGNOSIS — M5441 Lumbago with sciatica, right side: Secondary | ICD-10-CM | POA: Diagnosis present

## 2021-07-12 NOTE — Therapy (Signed)
[place], [place], [address] Phone: [phone]   Fax:  [phone]  Physical Therapy Treatment  Patient Details  Name: Sheri Roberts MRN: [id number] Date of Birth: [birth year] Referring Provider (PT): [place], [place]   Encounter Date: 07/12/2021   PT End of Session - 07/12/21 1855     Visit Number 4    Number of Visits 8    Date for PT Re-Evaluation 08/02/21    Authorization Type [place] MCD    Authorization Time Period 12 visits from 06/28/2021-08/27/2021    Authorization - Visit Number 3    Authorization - Number of Visits 12    PT Start Time [id number]    PT Stop Time 1910    PT Time Calculation (min) 40 min    Activity Tolerance Patient tolerated treatment well    Behavior During Therapy [facility] for tasks assessed/performed             Past Medical History:  Diagnosis Date   Anemia    Anxiety    Arthritis    Cyst of eye    Depression    Endometriosis of uterus    Headache    Hypertension    Idiopathic neuropathy    Morbid obesity ([place])    Neuralgia    Sickle cell trait ([place])     Past Surgical History:  Procedure Laterality Date   ANKLE SURGERY Right March 26 2015   seven screws put in left ankle due to a fall   spinal tap      There were no vitals filed for this visit.   Subjective Assessment - 07/12/21 1827     Subjective Pt reports experiencing an increase in R knee pain without any MOI starting about 1 hour ago. She states she has been out in town all day today. She reports increased LBP today, as well. She reports doing her exercises a few days out of the week.    Currently in Pain? Yes    Pain Score 8     Pain Location Back    Pain Orientation Right;Lower    Pain Descriptors / Indicators Aching    Pain Type Chronic pain                               [facility] Adult PT Treatment/Exercise - 07/12/21 0001       Lumbar Exercises: Stretches   Press Ups Other  (comment)   x10 with 10sec holds     Lumbar Exercises: Standing   Functional Squats Other (comment)   2x10 to mat table     Lumbar Exercises: Supine   Other Supine Lumbar Exercises Abdominal isometric knees into hands 3x to exhaustion      Lumbar Exercises: Sidelying   Other Sidelying Lumbar Exercises Open books x10 BIL      Lumbar Exercises: Prone   Other Prone Lumbar Exercises Knee plank 3x to exhaustion    Other Prone Lumbar Exercises Hip extension 2x10 BIL                    PT Education - 07/12/21 1847     Education Details Pt educated on proper form when performing new exercises.    Person(s) Educated Patient    Methods Explanation;Demonstration;Handout;Verbal cues    Comprehension Returned demonstration;Verbalized understanding;Verbal cues required              PT Short Term  Goals - 06/07/21 1809       PT SHORT TERM GOAL #1   Title Pt will report understanding and adherence to her HEP in order to establish independence in the management of her symptoms.    Baseline HEP given at baseline    Time 4    Period Weeks    Status New    Target Date 07/05/21               PT Long Term Goals - 06/07/21 1810       PT LONG TERM GOAL #1   Title Pt will report leg pain or N/T of <2 days per week in order to indicate centralization of peripheralized pain.    Baseline Pt experiences R leg pain and N/T daily.    Time 8    Period Weeks    Status New    Target Date 08/02/21      PT LONG TERM GOAL #2   Title Pt will demonstrate 60 degrees or more of lumbar flexion AROM with 4/10 pain or less in order to get dressed without difficulty.    Baseline 40 degrees of lumbar flexion AROM with 8/10 pain    Time 8    Period Weeks    Status New    Target Date 08/02/21      PT LONG TERM GOAL #3   Title Pt will demonstrate BIL hip global MMT of 4+/5 or greater in order to progress LE strengthening regimen without limitation.    Baseline BIL hip extension and  abduction = 3-/5    Time 8    Period Weeks    Status New    Target Date 08/02/21      PT LONG TERM GOAL #4   Title Pt will demonstrate BIL lumbar rotation of 60 degrees or greater with <5/10 pain in order to unload laundry or dishwasher without difficulty.    Baseline L lumbar rotation = 25 degrees with 8/10 pain; R lumbar rotation = 40 degrees with 8/10 pain    Time 8    Period Weeks    Status New    Target Date 08/02/21      PT LONG TERM GOAL #5   Title Pt will report ability to sleep through the night without limitation due to pain.    Baseline Pt regularly woken by pain.    Time 8    Period Weeks    Status New    Target Date 08/02/21                   Plan - 07/12/21 1903     Clinical Impression Statement Pt responded well to all treatment today with no increase in pain with new exercises. She reports that all the exercises today posed an appropriate challenge without exacerbating her symptoms.  She will benefit from skilled PT to address her primary impairments and return to her prior level of function with less limitation.    Personal Factors and Comorbidities Comorbidity 3+    Comorbidities See medical Hx above.    Examination-Activity Limitations Dressing;Sit;Squat;Bend;Lift    Examination-Participation Restrictions Interpersonal Relationship;Occupation;Laundry;Cleaning;Community Activity    Stability/Clinical Decision Making Stable/Uncomplicated    Clinical Decision Making Low    Rehab Potential Fair    PT Frequency 1x / week    PT Duration 8 weeks    PT Treatment/Interventions ADLs/Self Care Home Management;Aquatic Therapy;Biofeedback;Moist Heat;Electrical Stimulation;Cryotherapy;Traction;DME Instruction;Therapeutic activities;Functional mobility training;Stair training;Gait training;Therapeutic exercise;Balance training;Neuromuscular re-education;Cognitive remediation;Taping;Patient/family education;Manual techniques;Passive range  of motion;Spinal  Manipulations;Joint Manipulations;Dry needling    PT Next Visit Plan progress core/ hip strengthening, trial mechanical lumbar traction per pt request    PT Home Exercise Plan [place] and Agree with Plan of Care Patient             Patient will benefit from skilled therapeutic intervention in order to improve the following deficits and impairments:  Abnormal gait, Decreased range of motion, Decreased activity tolerance, Decreased strength, Hypermobility, Impaired flexibility, Postural dysfunction, Pain  Visit Diagnosis: Chronic bilateral low back pain with right-sided sciatica  Muscle weakness (generalized)     Problem List Patient Active Problem List   Diagnosis Date Noted   Chronic hypertension affecting pregnancy 03/02/2020   Depression affecting pregnancy 03/02/2020   Maternal morbid obesity, antepartum ([place]) 03/02/2020   Supervision of high-risk pregnancy 02/03/2020   Anxiety and depression 12/26/2017   Chronic pain 12/26/2017   Absolute anemia 12/25/2017   Chronic migraine without aura, not intractable, without status migrainosus 10/07/2017   Hypertensive retinopathy of both eyes 06/26/2016   Nuclear sclerotic cataract of both eyes 06/26/2016   Moderate episode of recurrent major depressive disorder ([place]) 06/14/2015   Allergic rhinitis 09/21/2014   Peripheral neuropathic pain 08/10/2014   Benign essential HTN 08/04/2014   Morbid obesity ([place]) 08/04/2014   Sickle-cell trait ([place]) 08/04/2014   Adenomyosis 08/04/2014   Chronic pelvic pain in female 06/07/2014    Vanessa [place], PT, DPT 07/12/21 7:08 PM   [facility] [address] [place], [place], [address] Phone: [phone]   Fax:  [phone]  Name: Sheri Roberts MRN: [id number] Date of Birth: [birth year]

## 2021-07-12 NOTE — Patient Instructions (Addendum)
   [id number]

## 2021-07-19 ENCOUNTER — Encounter: Payer: Self-pay | Admitting: Physical Therapy

## 2021-07-19 ENCOUNTER — Ambulatory Visit: Payer: Medicaid Other | Admitting: Physical Therapy

## 2021-07-19 ENCOUNTER — Other Ambulatory Visit: Payer: Self-pay

## 2021-07-19 DIAGNOSIS — G8929 Other chronic pain: Secondary | ICD-10-CM

## 2021-07-19 DIAGNOSIS — M5441 Lumbago with sciatica, right side: Secondary | ICD-10-CM | POA: Diagnosis not present

## 2021-07-19 DIAGNOSIS — M6281 Muscle weakness (generalized): Secondary | ICD-10-CM

## 2021-07-19 NOTE — Therapy (Signed)
[place], [place], [address] Phone: [phone]   Fax:  [phone]  Physical Therapy Treatment  Patient Details  Name: Sheri Roberts MRN: [id number] Date of Birth: [birth year] Referring Provider (PT): [place], [place]   Encounter Date: 07/19/2021   PT End of Session - 07/19/21 0828     Visit Number 5    Number of Visits 8    Date for PT Re-Evaluation 08/02/21    Authorization Type [place] MCD    Authorization Time Period 06/28/2021-08/27/2021    Authorization - Visit Number 4    Authorization - Number of Visits 12    PT Start Time 0830    PT Stop Time 0912    PT Time Calculation (min) 42 min    Activity Tolerance Patient tolerated treatment well    Behavior During Therapy [facility] for tasks assessed/performed             Past Medical History:  Diagnosis Date   Anemia    Anxiety    Arthritis    Cyst of eye    Depression    Endometriosis of uterus    Headache    Hypertension    Idiopathic neuropathy    Morbid obesity ([place])    Neuralgia    Sickle cell trait ([place])     Past Surgical History:  Procedure Laterality Date   ANKLE SURGERY Right March 26 2015   seven screws put in left ankle due to a fall   spinal tap      There were no vitals filed for this visit.   Subjective Assessment - 07/19/21 0834     Subjective Patient reports she is "trying, its a challenge but she is trying." States she does the exercises, they are hard but she does them. Reports she took a pain pill before she left so not too bad right now now, but last night was terrible.    Patient Stated Goals Decreased lumbar pain, sleep better at night, do activities with children    Currently in Pain? Yes    Pain Score 7     Pain Location Back    Pain Orientation Lower;Right    Pain Descriptors / Indicators Aching;Sharp    Pain Type Chronic pain    Pain Onset More than a month ago    Pain Frequency Constant                 [facility] PT Assessment - 07/19/21 0001       Strength   Right Hip Extension 3/5    Right Hip ABduction 3/5    Left Hip Extension 3-/5    Left Hip ABduction 3/5                           [facility] Adult PT Treatment/Exercise - 07/19/21 0001       Exercises   Exercises Lumbar      Lumbar Exercises: Stretches   Lower Trunk Rotation 5 reps;10 seconds    Piriformis Stretch 2 reps;20 seconds      Lumbar Exercises: Supine   Pelvic Tilt 5 reps;5 seconds   2 sets   Pelvic Tilt Limitations max verbal and tactile cueing to get patient to perform proper pelvic tilt    Bridge 5 reps;5 seconds   2 sets   Bridge Limitations max verbal cueing for gluteal engagement to arching lumbar    Large Ball Abdominal Isometric 5 reps;5 seconds  2 sets   Large Ball Abdominal Isometric Limitations red physioball      Lumbar Exercises: Sidelying   Clam 10 reps   2 sets   Clam Limitations yellow      Lumbar Exercises: Quadruped   Opposite Arm/Leg Raise 10 reps;3 seconds   2 sets                   PT Education - 07/19/21 0828     Education Details HEP    Person(s) Educated Patient    Methods Explanation;Demonstration;Verbal cues    Comprehension Verbalized understanding;Returned demonstration;Verbal cues required;Need further instruction              PT Short Term Goals - 07/19/21 0829       PT SHORT TERM GOAL #1   Title Pt will report understanding and adherence to her HEP in order to establish independence in the management of her symptoms.    Baseline continuing to progress HEP    Time 4    Period Weeks    Status On-going    Target Date 07/05/21               PT Long Term Goals - 06/07/21 1810       PT LONG TERM GOAL #1   Title Pt will report leg pain or N/T of <2 days per week in order to indicate centralization of peripheralized pain.    Baseline Pt experiences R leg pain and N/T daily.    Time 8    Period Weeks    Status New    Target  Date 08/02/21      PT LONG TERM GOAL #2   Title Pt will demonstrate 60 degrees or more of lumbar flexion AROM with 4/10 pain or less in order to get dressed without difficulty.    Baseline 40 degrees of lumbar flexion AROM with 8/10 pain    Time 8    Period Weeks    Status New    Target Date 08/02/21      PT LONG TERM GOAL #3   Title Pt will demonstrate BIL hip global MMT of 4+/5 or greater in order to progress LE strengthening regimen without limitation.    Baseline BIL hip extension and abduction = 3-/5    Time 8    Period Weeks    Status New    Target Date 08/02/21      PT LONG TERM GOAL #4   Title Pt will demonstrate BIL lumbar rotation of 60 degrees or greater with <5/10 pain in order to unload laundry or dishwasher without difficulty.    Baseline L lumbar rotation = 25 degrees with 8/10 pain; R lumbar rotation = 40 degrees with 8/10 pain    Time 8    Period Weeks    Status New    Target Date 08/02/21      PT LONG TERM GOAL #5   Title Pt will report ability to sleep through the night without limitation due to pain.    Baseline Pt regularly woken by pain.    Time 8    Period Weeks    Status New    Target Date 08/02/21                   Plan - 07/19/21 0829     Clinical Impression Statement Patient tolerated therapy well with no adverse effects. She exhibited significant difficulty performing appropriate pelvic tilt and engaging core this visit. requiring  constant cueing for muscle coordination. Continued to work on core/gluteal strengthening, patient reporting right sided low back pain with all exercises but no radiating right leg pain. No changes made to HEP this visit. She was instucted to schedule for aquatic therapy to work on exercise in an unloaded position. Patient would benefit from continued skilled PT to progress mobility and strength in order to reduce pain and maximize functional ability.    PT Treatment/Interventions ADLs/Self Care Home  Management;Aquatic Therapy;Biofeedback;Moist Heat;Electrical Stimulation;Cryotherapy;Traction;DME Instruction;Therapeutic activities;Functional mobility training;Stair training;Gait training;Therapeutic exercise;Balance training;Neuromuscular re-education;Cognitive remediation;Taping;Patient/family education;Manual techniques;Passive range of motion;Spinal Manipulations;Joint Manipulations;Dry needling    PT Next Visit Plan progress core/ hip strengthening, trial mechanical lumbar traction per pt request    PT [place]    Recommended Other Services Aquatic therapy scheduled    Consulted and Agree with Plan of Care Patient             Patient will benefit from skilled therapeutic intervention in order to improve the following deficits and impairments:  Abnormal gait, Decreased range of motion, Decreased activity tolerance, Decreased strength, Hypermobility, Impaired flexibility, Postural dysfunction, Pain  Visit Diagnosis: Chronic bilateral low back pain with right-sided sciatica  Muscle weakness (generalized)     Problem List Patient Active Problem List   Diagnosis Date Noted   Chronic hypertension affecting pregnancy 03/02/2020   Depression affecting pregnancy 03/02/2020   Maternal morbid obesity, antepartum ([place]) 03/02/2020   Supervision of high-risk pregnancy 02/03/2020   Anxiety and depression 12/26/2017   Chronic pain 12/26/2017   Absolute anemia 12/25/2017   Chronic migraine without aura, not intractable, without status migrainosus 10/07/2017   Hypertensive retinopathy of both eyes 06/26/2016   Nuclear sclerotic cataract of both eyes 06/26/2016   Moderate episode of recurrent major depressive disorder ([place]) 06/14/2015   Allergic rhinitis 09/21/2014   Peripheral neuropathic pain 08/10/2014   Benign essential HTN 08/04/2014   Morbid obesity ([place]) 08/04/2014   Sickle-cell trait ([place]) 08/04/2014   Adenomyosis 08/04/2014   Chronic pelvic pain in female  06/07/2014    Hilda Blades, PT, DPT, LAT, ATC 07/19/21  9:22 AM Phone: [phone] Fax: [place], [place], [address] Phone: [phone]   Fax:  [phone]  Name: Delesia Robert MRN: [id number] Date of Birth: [birth year]

## 2021-07-27 ENCOUNTER — Ambulatory Visit: Payer: Medicaid Other

## 2021-08-02 ENCOUNTER — Ambulatory Visit: Payer: Medicaid Other

## 2021-08-02 ENCOUNTER — Other Ambulatory Visit: Payer: Self-pay

## 2021-08-02 DIAGNOSIS — M6281 Muscle weakness (generalized): Secondary | ICD-10-CM

## 2021-08-02 DIAGNOSIS — M5441 Lumbago with sciatica, right side: Secondary | ICD-10-CM | POA: Diagnosis not present

## 2021-08-02 DIAGNOSIS — G8929 Other chronic pain: Secondary | ICD-10-CM

## 2021-08-02 NOTE — Therapy (Signed)
[place], [place], [address] Phone: [phone]   Fax:  [phone]  Physical Therapy Treatment/ Re-evaluation  Progress Note Reporting Period 06/07/2021 to 08/02/2021   See note below for Objective Data and Assessment of Progress/Goals.      Patient Details  Name: Sheri Roberts MRN: [id number] Date of Birth: 05-10-1976 Referring Provider (PT): [place], [place]   Encounter Date: 08/02/2021   PT End of Session - 08/02/21 1749     Visit Number 6    Number of Visits 8    Date for PT Re-Evaluation 09/27/21    Authorization Type [place] MCD    Authorization Time Period 06/28/2021-08/27/2021    Authorization - Visit Number 5    Authorization - Number of Visits 12    PT Start Time 1700    PT Stop Time 1745    PT Time Calculation (min) 45 min    Activity Tolerance Patient tolerated treatment well    Behavior During Therapy [facility] for tasks assessed/performed             Past Medical History:  Diagnosis Date   Anemia    Anxiety    Arthritis    Cyst of eye    Depression    Endometriosis of uterus    Headache    Hypertension    Idiopathic neuropathy    Morbid obesity ([place])    Neuralgia    Sickle cell trait ([place])     Past Surgical History:  Procedure Laterality Date   ANKLE SURGERY Right March 26 2015   seven screws put in left ankle due to a fall   spinal tap      There were no vitals filed for this visit.   Subjective Assessment - 08/02/21 1658     Subjective Pt reports doing her exercises about every other day, as well as walking a few days a week. She adds that she has been having additional life stressors involving her family. She reports increased R knee pain with walking.    Pain Score 8     Pain Location Back    Pain Orientation Lower    Pain Descriptors / Indicators Aching;Sharp;Throbbing    Pain Type Chronic pain    Multiple Pain Sites Yes    Pain Score 8    Pain Location Knee     Pain Orientation Right    Pain Descriptors / Indicators Throbbing                [facility] PT Assessment - 08/02/21 0001       AROM   Lumbar Flexion 75    Lumbar Extension 22 p!    Lumbar - Right Side Bend 20cm   with mild R sided LBP   Lumbar - Left Side Bend 21cm   with mild R sided LBP   Lumbar - Right Rotation 70 p!    Lumbar - Left Rotation 70 p!      Strength   Right Hip Extension 4+/5    Right Hip ABduction 5/5    Left Hip Extension 5/5    Left Hip ABduction 5/5      other   Findings Positive    Comment Plank testing, 35sec, terminated due to fatigue                           [facility] Adult PT Treatment/Exercise - 08/02/21 0001       Lumbar  Exercises: Aerobic   Elliptical x5 minutes at self-selected pace    Other Aerobic Exercise boxer shuffle with jab, jab, hook boxing sequence 2x15 sequences in traditional stance, southpaw stance, and side-to-side stance                    PT Education - 08/02/21 1748     Education Details Updated HEP to include boxer shuffles with boxing. Also introduced walking program for pt and gave her instructions regarding aquatic therapy.    Person(s) Educated Patient    Methods Explanation    Comprehension Verbalized understanding              PT Short Term Goals - 08/02/21 1736       PT SHORT TERM GOAL #1   Title Pt will report understanding and adherence to her HEP in order to establish independence in the management of her symptoms.    Baseline Pt reports regular HEP adherence    Time 4    Period Weeks    Status Achieved    Target Date 07/05/21               PT Long Term Goals - 08/02/21 1737       PT LONG TERM GOAL #1   Title Pt will report leg pain or N/T of <2 days per week in order to indicate centralization of peripheralized pain.    Baseline Pt experiences R leg pain and N/T 3-4 days per week. (08/02/2021)    Time 8    Period Weeks    Status On-going      PT LONG TERM  GOAL #2   Title Pt will demonstrate 60 degrees or more of lumbar flexion AROM with 4/10 pain or less in order to get dressed without difficulty.    Baseline 75d with 8/10 (08/02/2021)    Time 8    Period Weeks    Status Partially Met      PT LONG TERM GOAL #3   Title Pt will demonstrate BIL hip global MMT of 4+/5 or greater in order to progress LE strengthening regimen without limitation.    Baseline Achieved (08/02/2021)    Time 8    Period Weeks    Status Achieved      PT LONG TERM GOAL #4   Title Pt will demonstrate BIL lumbar rotation of 60 degrees or greater with <5/10 pain in order to unload laundry or dishwasher without difficulty.    Baseline L lumbar rotation = 70 degrees with 8/10 pain; R lumbar rotation = 70 degrees with 8/10 pain (08/02/2021)    Time 8    Period Weeks    Status Partially Met      PT LONG TERM GOAL #5   Title Pt will report ability to sleep through the night without limitation due to pain.    Baseline Pt regularly woken by pain.    Time 8    Period Weeks    Status On-going                   Plan - 08/02/21 1750     Clinical Impression Statement Pt responded well to all treatment today, reporting that the boxing exercise was fun. Aerobics may be a good intervention moving forward. Upon re-assessment of objective data, pt has made substantial improvement in global hip pain and lumbar AROM in all planes. Additionaly, due to aquatic therapy being scheduled, the pt will alternative aquatic and traditional therapy sessions  moving forward. She will continue to benefit from skilled PT to address her primary impairments and return to her prior level of function without limitation.    Personal Factors and Comorbidities Comorbidity 3+    Comorbidities See medical Hx above.    Examination-Activity Limitations Dressing;Sit;Squat;Bend;Lift    Examination-Participation Restrictions Interpersonal Relationship;Occupation;Laundry;Cleaning;Community Activity     Stability/Clinical Decision Making Stable/Uncomplicated    Clinical Decision Making Low    Rehab Potential Fair    PT Frequency 1x / week    PT Duration 8 weeks    PT Treatment/Interventions ADLs/Self Care Home Management;Aquatic Therapy;Biofeedback;Moist Heat;Electrical Stimulation;Cryotherapy;Traction;DME Instruction;Therapeutic activities;Functional mobility training;Stair training;Gait training;Therapeutic exercise;Balance training;Neuromuscular re-education;Cognitive remediation;Taping;Patient/family education;Manual techniques;Passive range of motion;Spinal Manipulations;Joint Manipulations;Dry needling    PT Next Visit Plan progress core/ hip strengthening, trial mechanical lumbar traction per pt request, progress aerobic program, assess response to walking program.    PT Home Exercise Plan [place] and Agree with Plan of Care Patient             Patient will benefit from skilled therapeutic intervention in order to improve the following deficits and impairments:  Abnormal gait, Decreased range of motion, Decreased activity tolerance, Decreased strength, Hypermobility, Impaired flexibility, Postural dysfunction, Pain  Visit Diagnosis: Chronic bilateral low back pain with right-sided sciatica  Muscle weakness (generalized)     Problem List Patient Active Problem List   Diagnosis Date Noted   Chronic hypertension affecting pregnancy 03/02/2020   Depression affecting pregnancy 03/02/2020   Maternal morbid obesity, antepartum ([place]) 03/02/2020   Supervision of high-risk pregnancy 02/03/2020   Anxiety and depression 12/26/2017   Chronic pain 12/26/2017   Absolute anemia 12/25/2017   Chronic migraine without aura, not intractable, without status migrainosus 10/07/2017   Hypertensive retinopathy of both eyes 06/26/2016   Nuclear sclerotic cataract of both eyes 06/26/2016   Moderate episode of recurrent major depressive disorder ([place]) 06/14/2015   Allergic rhinitis  09/21/2014   Peripheral neuropathic pain 08/10/2014   Benign essential HTN 08/04/2014   Morbid obesity ([place]) 08/04/2014   Sickle-cell trait ([place]) 08/04/2014   Adenomyosis 08/04/2014   Chronic pelvic pain in female 06/07/2014    Vanessa Creston, PT, DPT 08/02/21 6:09 PM   [facility] [address] [place], [place], [address] Phone: [phone]   Fax:  [phone]  Name: Sheri Roberts MRN: [id number] Date of Birth: 04-23-1976

## 2021-08-02 NOTE — Patient Instructions (Signed)
[id number]  Aquatic Therapy at [facility]-  What to Expect!  Where:   [facility] [place], [place] [address] Rehab phone [phone]  NOTE:  You will receive an automated phone message reminding you of your appt and it will say the appointment is at the [facility].          How to Prepare: Please make sure you drink 8 ounces of water about one hour prior to your pool session A caregiver may attend if needed with the patient to help assist as needed. A caregiver can sit in the pool room on chair. Please arrive IN YOUR SUIT and 15 minutes prior to your appointment - this helps to avoid delays in starting your session. Please make sure to attend to any toileting needs prior to entering the pool [place] rooms for changing are provided.   There is direct access to the pool deck form the locker room.  You can lock your belongings in a locker with lock provided. Once on the pool deck your therapist will ask if you have signed the Patient  Consent and Assignment of Benefits form before beginning treatment Your therapist may take your blood pressure prior to, during and after your session if indicated We usually try and create a home exercise program based on activities we do in the pool.  Please be thinking about who might be able to assist you in the pool should you need to participate in an aquatic home exercise program at the time of discharge if you need assistance.  Some patients do not want to or do not have the ability to participate in an aquatic home program - this is not a barrier in any way to you participating in aquatic therapy as part of your current therapy plan! After Discharge from PT, you can continue using home program at  the [facility], there is a drop-in fee for $5 ($45 a month)or for 60 years  or older $4.00 ($40 a month for seniors ) or any local [employer] pool.  Memberships for  purchase are available for gym/pool at [facility] LAST AQUATIC VISIT.  PLEASE MAKE SURE THAT YOU HAVE A [address]  APPOINTMENT SCHEDULED.   About the pool: Pool is located approximately 500 FT from the entrance of the building.  Please bring a support person if you need assistance traveling this      distance.   Your therapist will assist you in entering the water; there are two ways to           enter: stairs with railings, and a mechanical lift. Your therapist will determine the most appropriate way for you.  Water temperature is usually between 88-90 degrees  There may be up to 2 other swimmers in the pool at the same time  The pool deck is tile, please wear shoes with good traction if you prefer not to be barefoot.    Contact Info:  For appointment scheduling and cancellations:         Please call the [facility]       All sessions are 45 minutes

## 2021-08-09 ENCOUNTER — Other Ambulatory Visit: Payer: Self-pay

## 2021-08-09 ENCOUNTER — Ambulatory Visit: Payer: Medicaid Other | Admitting: Physical Therapy

## 2021-08-09 ENCOUNTER — Encounter: Payer: Self-pay | Admitting: Physical Therapy

## 2021-08-09 ENCOUNTER — Telehealth: Payer: Self-pay | Admitting: Physical Therapy

## 2021-08-09 DIAGNOSIS — M6281 Muscle weakness (generalized): Secondary | ICD-10-CM

## 2021-08-09 DIAGNOSIS — G8929 Other chronic pain: Secondary | ICD-10-CM

## 2021-08-09 DIAGNOSIS — M5441 Lumbago with sciatica, right side: Secondary | ICD-10-CM | POA: Diagnosis not present

## 2021-08-09 NOTE — Therapy (Signed)
[place], [place], [address] Phone: [phone]   Fax:  [phone]  Physical Therapy Treatment  Patient Details  Name: Sheri Roberts MRN: [id number] Date of Birth: 09-05-1976 Referring Provider (PT): [place], [place]   Encounter Date: 08/09/2021   PT End of Session - 08/09/21 1557     Visit Number 7    Number of Visits 8    Date for PT Re-Evaluation 09/27/21    Authorization Type [place] MCD    Authorization Time Period 06/28/2021-08/27/2021    Authorization - Visit Number 7    Authorization - Number of Visits 12    PT Start Time 0093    PT Stop Time 1640    PT Time Calculation (min) 45 min    Activity Tolerance Patient tolerated treatment well    Behavior During Therapy [facility] for tasks assessed/performed             Past Medical History:  Diagnosis Date   Anemia    Anxiety    Arthritis    Cyst of eye    Depression    Endometriosis of uterus    Headache    Hypertension    Idiopathic neuropathy    Morbid obesity ([place])    Neuralgia    Sickle cell trait ([place])     Past Surgical History:  Procedure Laterality Date   ANKLE SURGERY Right March 26 2015   seven screws put in left ankle due to a fall   spinal tap      There were no vitals filed for this visit.   Subjective Assessment - 08/09/21 1556     Subjective I am here for my back.  I work as a Quarry manager and I tried to get here as soon as I could.    Limitations Sitting;House hold activities    Diagnostic tests Pt had lumbar MRI performed earlier this year; results not in EPIC; Pt states she will request release of the reports to the clinic    Patient Stated Goals Decreased lumbar pain, sleep better at night, do activities with children    Currently in Pain? Yes    Pain Score 9     Pain Location Back    Pain Orientation Lower    Pain Descriptors / Indicators Aching;Sharp;Throbbing    Pain Type Chronic pain    Pain Onset More than a month  ago    Pain Frequency Constant    Pain Score 6    Pain Location Knee    Pain Orientation Right    Pain Descriptors / Indicators Throbbing               Aquatic therapy at [address] - therapeutic pool temp 94  degrees Pt enters building without AD.  Treatment took place in water 3.8 to  4 ft 8 in.feet deep depending upon activity.  Pt entered and exited the pool via stair and handrails independently.     Ms Treadway entered water for aquatic therapy for first time and was introduced to principles and therapeutic effects of water as she ambulated and acclimated to pool. Marland Kitchen    She was able to ambulate forward across pool , then backwards to engage posterior chain and then sidesteping    She was able to use aquatic barbells while ambulating to increase abdominal engagement with exercises.   Pt educated on neutral posture and hip hinging in seated position with water at chest level x 10 with  stretch to low back and then x 10 with back at pool wall at external cue, VC for neck tucked to prevent hyperextension.    Runners stretch  2 x Left and then moves into hamstring stretch  Then runners stretch on R and then move into hamstring stretch . TC to insure proper technique. Figure 4 squat stretch with UE support for R and L x 60 sec each        Pt performed LE exercise on edge of pool with bil UE support  Hip abd/add R/L 20 x each and then using 1 UE support Hip ext/flex with knee straight x 20, pt needing VC and TC for correct execution and sequencing Marching knee/hip 90/90 x 10 and then with added pool noodle for increased resistance x 10 each leg Ham curl R/L x 20.  Squats x 20 reps with intermittent UE support x 2 sets. Triangle pose with pool noodle [place] with pool noodle Trunk rotation with pool noodle   [place], Pt with lumbar belt around hips and nek doodle for neck support.  .  Pt assisted into supine floating position by lying head on shoulder of PT to  get into floating position. . PT at torso and assisting with trunk left to right and vice versa to engage trunk muscles. PT then rotated trunk in order to engage abdomnal (internal and external obliques)  Emphasis on breathing techniques to draw in abdominals for support.  Pt then utilizing posterior chain and engaging Hip extension and knee flexion with water resistance while PT used Aquastretch techniques to decrease muscle tension in low back on Bil gluteals and and right knee       Pt requires the buoyancy of water for active assisted exercises with buoyancy supported for strengthening and AROM exercises: PT  requires the viscosity of the water for resistance with strengthening exercises Hydrostatic pressure also supports joints by unweighting joint load by at least 50 % in 3-4 feet depth water. 80% in chest to neck deep water. Water will allow for  reduced joint loading through buoyancy to help patient improve posture without excess stress and pain.                        PT Education - 08/09/21 1658     Education Details Pt educated on aquatics and therapeutic effects of water for increasing movement and carry over onto land based clinic exercises    Person(s) Educated Patient    Methods Explanation;Demonstration;Tactile cues;Verbal cues    Comprehension Verbalized understanding;Returned demonstration              PT Short Term Goals - 08/02/21 1736       PT SHORT TERM GOAL #1   Title Pt will report understanding and adherence to her HEP in order to establish independence in the management of her symptoms.    Baseline Pt reports regular HEP adherence    Time 4    Period Weeks    Status Achieved    Target Date 07/05/21               PT Long Term Goals - 08/02/21 1737       PT LONG TERM GOAL #1   Title Pt will report leg pain or N/T of <2 days per week in order to indicate centralization of peripheralized pain.    Baseline Pt experiences R leg pain  and N/T 3-4 days per week. (08/02/2021)  Time 8    Period Weeks    Status On-going      PT LONG TERM GOAL #2   Title Pt will demonstrate 60 degrees or more of lumbar flexion AROM with 4/10 pain or less in order to get dressed without difficulty.    Baseline 75d with 8/10 (08/02/2021)    Time 8    Period Weeks    Status Partially Met      PT LONG TERM GOAL #3   Title Pt will demonstrate BIL hip global MMT of 4+/5 or greater in order to progress LE strengthening regimen without limitation.    Baseline Achieved (08/02/2021)    Time 8    Period Weeks    Status Achieved      PT LONG TERM GOAL #4   Title Pt will demonstrate BIL lumbar rotation of 60 degrees or greater with <5/10 pain in order to unload laundry or dishwasher without difficulty.    Baseline L lumbar rotation = 70 degrees with 8/10 pain; R lumbar rotation = 70 degrees with 8/10 pain (08/02/2021)    Time 8    Period Weeks    Status Partially Met      PT LONG TERM GOAL #5   Title Pt will report ability to sleep through the night without limitation due to pain.    Baseline Pt regularly woken by pain.    Time 8    Period Weeks    Status On-going                   Plan - 08/09/21 1648     Clinical Impression Statement Ms Willingham enters aquatic therapy for first time with gaurded motion and tentative,but quickly acclimated to water.  Pt was able to participate entire 40 minutes without break.Pt has 9/10 pain in low back and 6 in R knee at beginning and was able to decrease pain at end of session as reported verbally at end of session. Pt able to squat in water withno increase in pain and hip hinge without pain in water.   Pt requires the buoyancy of water for active assisted exercises with buoyancy supported for strengthening and AROM exercises: PT  requires the viscosity of the water for resistance with strengthening exercises  Hydrostatic pressure also supports joints by unweighting joint load by at least 50 % in 3-4  feet depth water. 80% in chest to neck deep water.  Water will allow for  reduced joint loading through buoyancy to help patient improve posture without excess stress and pain.    Personal Factors and Comorbidities Comorbidity 3+    Comorbidities See medical Hx above.    Examination-Activity Limitations Dressing;Sit;Squat;Bend;Lift    Examination-Participation Restrictions Interpersonal Relationship;Occupation;Laundry;Cleaning;Community Activity    PT Frequency 1x / week    PT Duration 8 weeks    PT Treatment/Interventions ADLs/Self Care Home Management;Aquatic Therapy;Biofeedback;Moist Heat;Electrical Stimulation;Cryotherapy;Traction;DME Instruction;Therapeutic activities;Functional mobility training;Stair training;Gait training;Therapeutic exercise;Balance training;Neuromuscular re-education;Cognitive remediation;Taping;Patient/family education;Manual techniques;Passive range of motion;Spinal Manipulations;Joint Manipulations;Dry needling    PT Next Visit Plan progress core/ hip strengthening, trial mechanical lumbar traction per pt request, progress aerobic program, assess response to walking program.    PT Home Exercise Plan [place] and Agree with Plan of Care Patient             Patient will benefit from skilled therapeutic intervention in order to improve the following deficits and impairments:  Abnormal gait, Decreased range of motion, Decreased activity tolerance, Decreased strength, Hypermobility,  Impaired flexibility, Postural dysfunction, Pain  Visit Diagnosis: Chronic bilateral low back pain with right-sided sciatica  Muscle weakness (generalized)     Problem List Patient Active Problem List   Diagnosis Date Noted   Chronic hypertension affecting pregnancy 03/02/2020   Depression affecting pregnancy 03/02/2020   Maternal morbid obesity, antepartum ([place]) 03/02/2020   Supervision of high-risk pregnancy 02/03/2020   Anxiety and depression 12/26/2017    Chronic pain 12/26/2017   Absolute anemia 12/25/2017   Chronic migraine without aura, not intractable, without status migrainosus 10/07/2017   Hypertensive retinopathy of both eyes 06/26/2016   Nuclear sclerotic cataract of both eyes 06/26/2016   Moderate episode of recurrent major depressive disorder ([place]) 06/14/2015   Allergic rhinitis 09/21/2014   Peripheral neuropathic pain 08/10/2014   Benign essential HTN 08/04/2014   Morbid obesity ([place]) 08/04/2014   Sickle-cell trait ([place]) 08/04/2014   Adenomyosis 08/04/2014   Chronic pelvic pain in female 06/07/2014  Voncille Lo, PT, [place] Certified Exercise Expert for the Aging Adult  08/09/21 5:00 PM Phone: [phone] Fax: [place], [place], [address] Phone: [phone]   Fax:  [phone]  Name: Suha Schoenbeck MRN: [id number] Date of Birth: 04-12-1976

## 2021-09-01 ENCOUNTER — Ambulatory Visit: Payer: Medicaid Other | Attending: Physician Assistant

## 2021-09-01 ENCOUNTER — Other Ambulatory Visit: Payer: Self-pay

## 2021-09-01 DIAGNOSIS — M6281 Muscle weakness (generalized): Secondary | ICD-10-CM | POA: Insufficient documentation

## 2021-09-01 DIAGNOSIS — M5441 Lumbago with sciatica, right side: Secondary | ICD-10-CM | POA: Diagnosis not present

## 2021-09-01 DIAGNOSIS — G8929 Other chronic pain: Secondary | ICD-10-CM | POA: Insufficient documentation

## 2021-09-01 NOTE — Patient Instructions (Addendum)
Pt instructed to perform her HEP daily. Also informed on objective measures taken today.

## 2021-09-01 NOTE — Therapy (Signed)
[place], [place], [address] Phone: [phone]   Fax:  [phone]  Physical Therapy Treatment  Patient Details  Name: Sheri Roberts MRN: [id number] Date of Birth: 1976/01/25 Referring Provider (PT): [place], [place]   Encounter Date: 09/01/2021   PT End of Session - 09/01/21 1204     Visit Number 8    Number of Visits 12    Date for PT Re-Evaluation 09/29/21    Authorization Type [place] MCD    Authorization Time Period Pending re-authorization    Authorization - Visit Number --   Pending re-auth   Authorization - Number of Visits --   Pending re-auth   PT Start Time 1130    PT Stop Time 1215    PT Time Calculation (min) 45 min    Activity Tolerance Patient tolerated treatment well;Patient limited by pain    Behavior During Therapy [facility] for tasks assessed/performed             Past Medical History:  Diagnosis Date   Anemia    Anxiety    Arthritis    Cyst of eye    Depression    Endometriosis of uterus    Headache    Hypertension    Idiopathic neuropathy    Morbid obesity ([place])    Neuralgia    Sickle cell trait ([place])     Past Surgical History:  Procedure Laterality Date   ANKLE SURGERY Right March 26 2015   seven screws put in left ankle due to a fall   spinal tap      There were no vitals filed for this visit.   Subjective Assessment - 09/01/21 1126     Subjective Pt reports she has been very busy lately with her children playing soccer. She adds that she hasn't done her home exercises regularly due to being busy. She also reports that the cooler weather increases her pain.    Limitations Sitting;House hold activities;Standing;Walking    How long can you sit comfortably? 15-30 minutes due to LBP    How long can you stand comfortably? 15 minutes due to ankle pain    How long can you walk comfortably? 20-30 minutes due to LBP    Currently in Pain? Yes    Pain Score 8     Pain  Location Back    Pain Orientation Lower    Pain Descriptors / Indicators Aching;Sharp;Throbbing    Pain Type Chronic pain    Pain Onset More than a month ago    Pain Frequency Constant                [facility] PT Assessment - 09/01/21 0001       AROM   Lumbar Flexion 60    Lumbar Extension 20p!    Lumbar - Right Side Bend 17cm p!    Lumbar - Left Side Bend 14cm    Lumbar - Right Rotation 72 p!    Lumbar - Left Rotation 48 p!      Strength   Right Hip Flexion 4+/5    Right Hip Extension 5/5    Right Hip ABduction 5/5    Left Hip Flexion 4+/5    Left Hip Extension 5/5    Left Hip ABduction 5/5                           [facility] Adult PT Treatment/Exercise - 09/01/21 0001  Lumbar Exercises: Stretches   Other Lumbar Stretch Exercise Supine lower trunk rotation x10 BIL      Lumbar Exercises: Standing   Other Standing Lumbar Exercises Trunk rotation with 7# cable 2x10 BIL      Lumbar Exercises: Supine   Large Ball Abdominal Isometric Limitations 3x30sec with Red physioball      Knee/Hip Exercises: Machines for Strengthening   Hip Cybex Abduction and extension with 37.5# 2x10 BIL each                     PT Education - 09/01/21 1202     Education Details Pt educated on importance of regular HEP adherence. Also educated on POC moving forward.    Person(s) Educated Patient    Methods Explanation    Comprehension Verbalized understanding              PT Short Term Goals - 08/02/21 1736       PT SHORT TERM GOAL #1   Title Pt will report understanding and adherence to her HEP in order to establish independence in the management of her symptoms.    Baseline Pt reports regular HEP adherence    Time 4    Period Weeks    Status Achieved    Target Date 07/05/21               PT Long Term Goals - 09/01/21 1159       PT LONG TERM GOAL #1   Title Pt will report leg pain or N/T of <2 days per week in order to indicate  centralization of peripheralized pain.    Baseline Pt experiences R leg pain and N/T 2-4 days per week. (09/01/2021)    Time 8    Period Weeks    Status On-going      PT LONG TERM GOAL #2   Title Pt will demonstrate 60 degrees or more of lumbar flexion AROM with 4/10 pain or less in order to get dressed without difficulty.    Baseline 60d with 8/10 pain (09/01/2021)    Time 8    Period Weeks    Status Partially Met      PT LONG TERM GOAL #3   Title Pt will demonstrate BIL hip global MMT of 4+/5 or greater in order to progress LE strengthening regimen without limitation.    Baseline Achieved (08/02/2021)    Time 8    Period Weeks    Status Achieved      PT LONG TERM GOAL #4   Title Pt will demonstrate BIL lumbar rotation of 60 degrees or greater with <5/10 pain in order to unload laundry or dishwasher without difficulty.    Baseline see flowsheet    Time 8    Period Weeks    Status Partially Met      PT LONG TERM GOAL #5   Title Pt will report ability to sleep through the night without limitation due to pain.    Baseline Pt regularly woken by pain.    Time 8    Period Weeks    Status On-going                   Plan - 09/01/21 1205     Clinical Impression Statement Upon re-assessment of ojective measures, the pt has regressed in lumbar ROM, although she has maintained WNL hip strength. Her pain levels remain elevated, which may be confounded by non-compliance with HEP over the past month since  her last appointment. She tolerated all exercises well, demonstrating proper form and mild R sided LBP with core strengthening exercises. Discussed the importance of regular HEP adherence as it relates to her prognosis with conservative management of her sxs. She agrees to this plan. The pt will benefit from continued skilled PT to address her primary impairments and return to her prior level of function without limitation.    Personal Factors and Comorbidities Comorbidity 3+     Comorbidities See medical Hx above.    Examination-Activity Limitations Dressing;Sit;Squat;Bend;Lift    Examination-Participation Restrictions Interpersonal Relationship;Occupation;Laundry;Cleaning;Community Activity    Stability/Clinical Decision Making Stable/Uncomplicated    Clinical Decision Making Low    Rehab Potential Fair    PT Frequency 1x / week    PT Duration 4 weeks    PT Treatment/Interventions ADLs/Self Care Home Management;Aquatic Therapy;Biofeedback;Moist Heat;Electrical Stimulation;Cryotherapy;Traction;DME Instruction;Therapeutic activities;Functional mobility training;Stair training;Gait training;Therapeutic exercise;Balance training;Neuromuscular re-education;Cognitive remediation;Taping;Patient/family education;Manual techniques;Passive range of motion;Spinal Manipulations;Joint Manipulations;Dry needling    PT Next Visit Plan progress core/ hip strengthening, trial mechanical lumbar traction per pt request, progress aerobic program, assess response to walking program.    PT Home Exercise Plan [id number]    Recommended Other Services Aquatic Therapy    Consulted and Agree with Plan of Care Patient             Patient will benefit from skilled therapeutic intervention in order to improve the following deficits and impairments:  Abnormal gait, Decreased range of motion, Decreased activity tolerance, Decreased strength, Hypermobility, Impaired flexibility, Postural dysfunction, Pain  Visit Diagnosis: Chronic bilateral low back pain with right-sided sciatica  Muscle weakness (generalized)     Problem List Patient Active Problem List   Diagnosis Date Noted   Chronic hypertension affecting pregnancy 03/02/2020   Depression affecting pregnancy 03/02/2020   Maternal morbid obesity, antepartum ([place]) 03/02/2020   Supervision of high-risk pregnancy 02/03/2020   Anxiety and depression 12/26/2017   Chronic pain 12/26/2017   Absolute anemia 12/25/2017   Chronic migraine  without aura, not intractable, without status migrainosus 10/07/2017   Hypertensive retinopathy of both eyes 06/26/2016   Nuclear sclerotic cataract of both eyes 06/26/2016   Moderate episode of recurrent major depressive disorder ([place]) 06/14/2015   Allergic rhinitis 09/21/2014   Peripheral neuropathic pain 08/10/2014   Benign essential HTN 08/04/2014   Morbid obesity ([place]) 08/04/2014   Sickle-cell trait ([place]) 08/04/2014   Adenomyosis 08/04/2014   Chronic pelvic pain in female 06/07/2014    Vanessa [place], PT, DPT 09/01/21 12:15 PM   [facility] [address] [place], [place], [address] Phone: [phone]   Fax:  [phone]  Name: Gayle Martinez MRN: [id number] Date of Birth: 08-06-1976

## 2021-09-06 ENCOUNTER — Ambulatory Visit (HOSPITAL_BASED_OUTPATIENT_CLINIC_OR_DEPARTMENT_OTHER): Payer: Medicaid Other | Attending: Physician Assistant | Admitting: Physical Therapy

## 2021-09-06 ENCOUNTER — Other Ambulatory Visit: Payer: Self-pay

## 2021-09-06 ENCOUNTER — Ambulatory Visit: Payer: Medicaid Other | Admitting: Physical Therapy

## 2021-09-06 DIAGNOSIS — M6281 Muscle weakness (generalized): Secondary | ICD-10-CM | POA: Diagnosis present

## 2021-09-06 DIAGNOSIS — G8929 Other chronic pain: Secondary | ICD-10-CM | POA: Insufficient documentation

## 2021-09-06 DIAGNOSIS — M5441 Lumbago with sciatica, right side: Secondary | ICD-10-CM | POA: Insufficient documentation

## 2021-09-06 NOTE — Therapy (Signed)
[place] [address] [place], [place], [address] Phone: [phone]   Fax:  [phone]  Physical Therapy Treatment  Patient Details  Name: Sheri Roberts MRN: [id number] Date of Birth: 06-Oct-1976 Referring Provider (PT): [place], [place]   Encounter Date: 09/06/2021   PT End of Session - 09/06/21 0917     Visit Number 9    Number of Visits 12    Date for PT Re-Evaluation 09/29/21    Authorization Type  MCD    Authorization Time Period Pending re-authorization    Authorization - Visit Number --   Pending re-auth   Authorization - Number of Visits --   Pending re-auth   PT Start Time 0915    PT Stop Time 0948    PT Time Calculation (min) 33 min    Activity Tolerance Patient tolerated treatment well;Patient limited by pain    Behavior During Therapy [facility] for tasks assessed/performed             Past Medical History:  Diagnosis Date   Anemia    Anxiety    Arthritis    Cyst of eye    Depression    Endometriosis of uterus    Headache    Hypertension    Idiopathic neuropathy    Morbid obesity ([place])    Neuralgia    Sickle cell trait ([place])     Past Surgical History:  Procedure Laterality Date   ANKLE SURGERY Right March 26 2015   seven screws put in left ankle due to a fall   spinal tap      There were no vitals filed for this visit.   Subjective Assessment - 09/06/21 0915     Subjective Pt reporting increased neuropathy pain starting yesterday 9/10 today 7/10.  Tried taking meds to releive which didn't work.  Presents today with significant discomfort hoping to get some relief.    Pain Score 7     Pain Location Back    Pain Orientation Lower    Pain Descriptors / Indicators Aching;Sharp;Throbbing    Pain Type Chronic pain    Pain Score 7    Pain Orientation Right                                          PT Short Term Goals - 08/02/21 1736       PT SHORT TERM  GOAL #1   Title Pt will report understanding and adherence to her HEP in order to establish independence in the management of her symptoms.    Baseline Pt reports regular HEP adherence    Time 4    Period Weeks    Status Achieved    Target Date 07/05/21               PT Long Term Goals - 09/01/21 1159       PT LONG TERM GOAL #1   Title Pt will report leg pain or N/T of <2 days per week in order to indicate centralization of peripheralized pain.    Baseline Pt experiences R leg pain and N/T 2-4 days per week. (09/01/2021)    Time 8    Period Weeks    Status On-going      PT LONG TERM GOAL #2   Title Pt will demonstrate 60 degrees or more of lumbar flexion AROM with 4/10 pain or less  in order to get dressed without difficulty.    Baseline 60d with 8/10 pain (09/01/2021)    Time 8    Period Weeks    Status Partially Met      PT LONG TERM GOAL #3   Title Pt will demonstrate BIL hip global MMT of 4+/5 or greater in order to progress LE strengthening regimen without limitation.    Baseline Achieved (08/02/2021)    Time 8    Period Weeks    Status Achieved      PT LONG TERM GOAL #4   Title Pt will demonstrate BIL lumbar rotation of 60 degrees or greater with <5/10 pain in order to unload laundry or dishwasher without difficulty.    Baseline see flowsheet    Time 8    Period Weeks    Status Partially Met      PT LONG TERM GOAL #5   Title Pt will report ability to sleep through the night without limitation due to pain.    Baseline Pt regularly woken by pain.    Time 8    Period Weeks    Status On-going           Aquatic therapy at [address] - therapeutic pool temp 92  degrees Pt enters building without AD.  Treatment took place in water 3.8 to  4 ft 8 in.feet deep depending upon activity.  Pt entered and exited the pool via stair and handrails independently.     Warm up Water walking:She was able to ambulate forward across pool , then backwards to  engage posterior chain and then sidesteping. Added aquatic barbells while ambulating to increase abdominal engagement with exercises. 10 widths   Seated Stretching Hamstring and gastroc 3 x 20 second hold  Standing  Stretch: -LB and core rotators pike position at step holding to handrails 4 x 30 sec hold. -Hip hinges 2 x 10 using kick board Strengtehing: - kick board push downs 2 x 10 vc for tightend core -kick board rotation R/L x 10 vc and demo for proper execution -Marching x 10 -noodle kick down x10 R/L cues for tightened core throughout activity for core strengthening.   Continued education on neutral posture  Vertically suspended  -using noodle in 4.65ft. knees to chest x 10  - gentle core rotation for LB stretching and recovery.   Water walking for recovery between exercises 2-4 widths.     Pt requires the buoyancy of water for active assisted exercises with buoyancy supported for strengthening and AROM exercises: PT  requires the viscosity of the water for resistance with strengthening exercises Hydrostatic pressure also supports joints by unweighting joint load by at least 50 % in 3-4 feet depth water. 80% in chest to neck deep water. Water will allow for  reduced joint loading through buoyancy to help patient improve posture without excess stress and pain.        Plan - 09/06/21 0954     Clinical Impression Statement Pt late for session. Initiated treatment with Lumbar stretching.  Pt with increased c/o discomofrt over last few days and presents in guarded position. Focus today to re instruct pt on neutral positioning, stretching and strengthening for improved function and pain relief. Lumbar musculature tightness, with gentle stretching some decrease in symptoms.    Stability/Clinical Decision Making Stable/Uncomplicated    Clinical Decision Making Low    Rehab Potential Fair    PT Frequency 1x / week    PT Treatment/Interventions ADLs/Self Care  Home  Management;Aquatic Therapy;Biofeedback;Moist Heat;Electrical Stimulation;Cryotherapy;Traction;DME Instruction;Therapeutic activities;Functional mobility training;Stair training;Gait training;Therapeutic exercise;Balance training;Neuromuscular re-education;Cognitive remediation;Taping;Patient/family education;Manual techniques;Passive range of motion;Spinal Manipulations;Joint Manipulations;Dry needling    PT Next Visit Plan progress core/ hip strengthening, trial mechanical lumbar traction per pt request, progress aerobic program, assess response to walking program.    PT Home Exercise Plan [email or web address]             Patient will benefit from skilled therapeutic intervention in order to improve the following deficits and impairments:  Abnormal gait, Decreased range of motion, Decreased activity tolerance, Decreased strength, Hypermobility, Impaired flexibility, Postural dysfunction, Pain  Visit Diagnosis: Chronic bilateral low back pain with right-sided sciatica  Muscle weakness (generalized)     Problem List Patient Active Problem List   Diagnosis Date Noted   Chronic hypertension affecting pregnancy 03/02/2020   Depression affecting pregnancy 03/02/2020   Maternal morbid obesity, antepartum ([place]) 03/02/2020   Supervision of high-risk pregnancy 02/03/2020   Anxiety and depression 12/26/2017   Chronic pain 12/26/2017   Absolute anemia 12/25/2017   Chronic migraine without aura, not intractable, without status migrainosus 10/07/2017   Hypertensive retinopathy of both eyes 06/26/2016   Nuclear sclerotic cataract of both eyes 06/26/2016   Moderate episode of recurrent major depressive disorder ([place]) 06/14/2015   Allergic rhinitis 09/21/2014   Peripheral neuropathic pain 08/10/2014   Benign essential HTN 08/04/2014   Morbid obesity ([place]) 08/04/2014   Sickle-cell trait ([place]) 08/04/2014   Adenomyosis 08/04/2014   Chronic pelvic pain in female 06/07/2014    Annamarie Major) Reshad Saab  MPT   09/06/2021, 2:03 PM  [place] [facility] [address] [place], [place], [address] Phone: [phone]   Fax:  [phone]  Name: Sheri Roberts MRN: [id number] Date of Birth: 1976/01/21

## 2021-09-12 ENCOUNTER — Ambulatory Visit: Payer: Medicaid Other

## 2021-09-20 ENCOUNTER — Ambulatory Visit: Payer: Medicaid Other | Admitting: Physical Therapy

## 2021-09-21 ENCOUNTER — Ambulatory Visit (HOSPITAL_BASED_OUTPATIENT_CLINIC_OR_DEPARTMENT_OTHER): Payer: Medicaid Other | Admitting: Physical Therapy

## 2021-09-29 ENCOUNTER — Ambulatory Visit: Payer: Medicaid Other | Attending: Physician Assistant

## 2021-09-29 DIAGNOSIS — M6281 Muscle weakness (generalized): Secondary | ICD-10-CM | POA: Diagnosis present

## 2021-09-29 DIAGNOSIS — G8929 Other chronic pain: Secondary | ICD-10-CM | POA: Insufficient documentation

## 2021-09-29 DIAGNOSIS — M5441 Lumbago with sciatica, right side: Secondary | ICD-10-CM | POA: Insufficient documentation

## 2021-09-29 NOTE — Patient Instructions (Signed)
  [id number]

## 2021-09-29 NOTE — Therapy (Signed)
[place], [place], [address] Phone: [phone]   Fax:  [phone]  Physical Therapy Treatment/ Discharge Summary  Patient Details  Name: Deashia Soule MRN: [id number] Date of Birth: October 07, 1976 Referring Provider (PT): [place], [place]   Encounter Date: 09/29/2021   PT End of Session - 09/29/21 1028     Visit Number 10    Number of Visits 12    Date for PT Re-Evaluation 09/29/21    Authorization Type [place] MCD    Authorization Time Period Pending re-authorization    PT Start Time 1010   pt arrived 10 minutes late to her appointment.   PT Stop Time 1045    PT Time Calculation (min) 35 min    Activity Tolerance Patient tolerated treatment well;Patient limited by pain    Behavior During Therapy [facility] for tasks assessed/performed             Past Medical History:  Diagnosis Date   Anemia    Anxiety    Arthritis    Cyst of eye    Depression    Endometriosis of uterus    Headache    Hypertension    Idiopathic neuropathy    Morbid obesity ([place])    Neuralgia    Sickle cell trait [facility])     Past Surgical History:  Procedure Laterality Date   ANKLE SURGERY Right March 26 2015   seven screws put in left ankle due to a fall   spinal tap      There were no vitals filed for this visit.   Subjective Assessment - 09/29/21 1014     Subjective Pt reports that her back is not hurting currently, although she reports that she hurt it when moving furniture last weekend. She reports walking, although she reports varied HEP adherence. She also reports that she experiences LE pain every night that limits her sleep. She reports that she feels indepdnent enough in her HEP to be discharged from PT at this time.    How long can you sit comfortably? 30 minutes    How long can you stand comfortably? 20 minutes    How long can you walk comfortably? 15-20 minutes    Currently in Pain? No/denies    Pain Score 0-No  pain                [facility] PT Assessment - 09/29/21 0001       AROM   Lumbar Flexion 65    Lumbar Extension 20   minor pain   Lumbar - Right Side Bend 20cm    Lumbar - Left Side Bend 17cm   minor pain   Lumbar - Right Rotation 90   minor pain   Lumbar - Left Rotation 90   minor pain     Strength   Right Hip Flexion 5/5    Left Hip Flexion 5/5                           [facility] Adult PT Treatment/Exercise - 09/29/21 0001       Lumbar Exercises: Standing   Other Standing Lumbar Exercises Trunk Side bend with blue TB 2x10 BIL      Lumbar Exercises: Supine   Other Supine Lumbar Exercises 90/90 curl up with outstretched arms                     PT Education - 09/29/21 1033  Education Details Updated HEP, instructed on how to independently progress    Person(s) Educated Patient    Methods Explanation;Demonstration;Handout    Comprehension Verbalized understanding;Returned demonstration              PT Short Term Goals - 08/02/21 1736       PT SHORT TERM GOAL #1   Title Pt will report understanding and adherence to her HEP in order to establish independence in the management of her symptoms.    Baseline Pt reports regular HEP adherence    Time 4    Period Weeks    Status Achieved    Target Date 07/05/21               PT Long Term Goals - 09/29/21 1039       PT LONG TERM GOAL #1   Title Pt will report leg pain or N/T of <2 days per week in order to indicate centralization of peripheralized pain.    Baseline Pt experiences R leg pain and N/T 2-4 days per week. (09/01/2021); Pt reports experiencing LE pain/ sxs nightly (09/29/2021)    Time 8    Period Weeks    Status Not Met      PT LONG TERM GOAL #2   Title Pt will demonstrate 60 degrees or more of lumbar flexion AROM with 4/10 pain or less in order to get dressed without difficulty.    Baseline 60d with 8/10 pain (09/01/2021); 65 degrees with no pain (09/29/2021)    Time  8    Period Weeks    Status Achieved      PT LONG TERM GOAL #3   Title Pt will demonstrate BIL hip global MMT of 4+/5 or greater in order to progress LE strengthening regimen without limitation.    Baseline Achieved (08/02/2021)    Time 8    Period Weeks    Status Achieved      PT LONG TERM GOAL #4   Title Pt will demonstrate BIL lumbar rotation of 60 degrees or greater with <5/10 pain in order to unload laundry or dishwasher without difficulty.    Baseline see flowsheet (Achieved 09/29/2021)    Time 8    Period Weeks    Status Achieved      PT LONG TERM GOAL #5   Title Pt will report ability to sleep through the night without limitation due to pain.    Baseline Pt regularly woken by pain.    Time 8    Period Weeks    Status Not Met                   Plan - 09/29/21 1037     Clinical Impression Statement Upon reassessment, the pt has continued to make progress in baseline pain levels and lumbar AROM in all planes. She has met most of her rehab goals, except for still having LE pain most nights of the week. The pt opts to discontinue PT at this time to independently progress her HEP in the management of her sxs. She is discharged from PT at this time. She tolerated newly introduced exercises well, demonstrating good form and no increase in pain with interventions.    PT Next Visit Plan Pt is discharged from PT at this time    PT [place] and Agree with Plan of Care Patient             Patient will benefit from  skilled therapeutic intervention in order to improve the following deficits and impairments:     Visit Diagnosis: Chronic bilateral low back pain with right-sided sciatica  Muscle weakness (generalized)     Problem List Patient Active Problem List   Diagnosis Date Noted   Chronic hypertension affecting pregnancy 03/02/2020   Depression affecting pregnancy 03/02/2020   Maternal morbid obesity, antepartum ([place])  03/02/2020   Supervision of high-risk pregnancy 02/03/2020   Anxiety and depression 12/26/2017   Chronic pain 12/26/2017   Absolute anemia 12/25/2017   Chronic migraine without aura, not intractable, without status migrainosus 10/07/2017   Hypertensive retinopathy of both eyes 06/26/2016   Nuclear sclerotic cataract of both eyes 06/26/2016   Moderate episode of recurrent major depressive disorder ([place]) 06/14/2015   Allergic rhinitis 09/21/2014   Peripheral neuropathic pain 08/10/2014   Benign essential HTN 08/04/2014   Morbid obesity ([place]) 08/04/2014   Sickle-cell trait ([place]) 08/04/2014   Adenomyosis 08/04/2014   Chronic pelvic pain in female 06/07/2014      [place], [place], [address] Phone: [phone]   Fax:  [phone]  Name: Darah Simkin MRN: [id number] Date of Birth: 1976-06-20  PHYSICAL THERAPY DISCHARGE SUMMARY  Visits from Start of Care: 10  Current functional level related to goals / functional outcomes: Pt has met her rehab goals concerning ROM, strength, and functional independence.   Remaining deficits: LE pain at night, limiting sleep   Education / Equipment: HEP   Patient agrees to discharge. Patient goals were partially met. Patient is being discharged due to being pleased with the current functional level.  Vanessa [place], PT, DPT 09/29/21 10:47 AM

## 2022-02-13 ENCOUNTER — Telehealth: Payer: Self-pay | Admitting: *Deleted

## 2022-02-13 NOTE — Telephone Encounter (Signed)
TC from pt requesting date of last Pap. Advised no Pap on record. Last visit for UPT. Prior to that f/u [facility]. No Pap done. Patient offered appt. Declined at this time. ?

## 2022-04-24 ENCOUNTER — Telehealth: Payer: Self-pay | Admitting: Obstetrics and Gynecology

## 2022-04-24 ENCOUNTER — Ambulatory Visit (INDEPENDENT_AMBULATORY_CARE_PROVIDER_SITE_OTHER): Payer: Medicaid Other | Admitting: Obstetrics & Gynecology

## 2022-04-24 ENCOUNTER — Telehealth: Payer: Self-pay | Admitting: *Deleted

## 2022-04-24 ENCOUNTER — Encounter: Payer: Self-pay | Admitting: Obstetrics & Gynecology

## 2022-04-24 VITALS — BP 128/82 | HR 88 | Ht 63.0 in | Wt 239.0 lb

## 2022-04-24 DIAGNOSIS — N938 Other specified abnormal uterine and vaginal bleeding: Secondary | ICD-10-CM

## 2022-04-24 DIAGNOSIS — D259 Leiomyoma of uterus, unspecified: Secondary | ICD-10-CM

## 2022-04-24 MED ORDER — MEGESTROL ACETATE 40 MG PO TABS: 40.0000 mg | ORAL_TABLET | Freq: Two times a day (BID) | ORAL | 1 refills | Status: DC

## 2022-04-24 NOTE — Telephone Encounter (Signed)
Lab called - HgB 5.3/Hct 17.1 ? ?Reviewed note from appointment today - already started on [place].  ? ?Radene Gunning, MD ?Attending [place] Faculty Practice ?[facility] ? ? ?

## 2022-04-24 NOTE — Progress Notes (Signed)
46 y.o GYN presents for AUB, c/o bleeding for 3 weeks, changing pads every 2 hours.  ?

## 2022-04-24 NOTE — Progress Notes (Signed)
Patient ID: Sheri Roberts, female   DOB: [birth year], 46 y.o.   MRN: [id number] ? ?Chief Complaint  ?Patient presents with  ? Gynecologic Exam  ?  PAP/AUB  ? ? ?HPI ?Sheri Roberts is a 46 y.o. female.  [id number] ?Patient's last menstrual period was 04/11/2022. ?Her last menses was late, having no period in April, and continues to bleed now with clots. This is unusual for her. No BCM and she would like to conceive. ?HPI ? ?Past Medical History:  ?Diagnosis Date  ? Anemia   ? Anxiety   ? Arthritis   ? Cyst of eye   ? Depression   ? Endometriosis of uterus   ? Headache   ? Hypertension   ? Idiopathic neuropathy   ? Morbid obesity ([place])   ? Neuralgia   ? Sickle cell trait ([place])   ? ? ?Past Surgical History:  ?Procedure Laterality Date  ? ANKLE SURGERY Right March 26 2015  ? seven screws put in left ankle due to a fall  ? spinal tap    ? ? ?Family History  ?Adopted: Yes  ?Problem Relation Age of Onset  ? Stroke Mother 54  ? Hypertension Mother   ? Hypertension Father   ? Diabetes Other   ? Glaucoma Other   ? Hypertension Other   ? Mental illness Sister   ? Mental retardation Sister   ? ? ?Social History ?Social History  ? ?Tobacco Use  ? Smoking status: Former  ?  Packs/day: 0.10  ?  Years: 15.00  ?  Pack years: 1.50  ?  Types: Cigarettes  ? Smokeless tobacco: Never  ?Vaping Use  ? Vaping Use: Never used  ?Substance Use Topics  ? Alcohol use: No  ? Drug use: No  ? ? ?Allergies  ?Allergen Reactions  ? Bee Venom Swelling  ? Penicillins Hives  ? Sulfa Antibiotics Hives  ? ? ?Current Outpatient Medications  ?Medication Sig Dispense Refill  ? cariprazine (VRAYLAR) capsule Take by mouth.    ? cetirizine (ZYRTEC) 10 MG tablet Take 1 tablet (10 mg total) by mouth daily. 90 tablet 4  ? diclofenac Sodium (VOLTAREN) 1 % GEL Apply topically.    ? EPINEPHrine 0.3 mg/0.3 mL IJ SOAJ injection Inject into the muscle once.    ? Erenumab-aooe (AIMOVIG) 70 MG/ML SOAJ Inject into the skin.    ? fluticasone (FLONASE) 50 MCG/ACT nasal spray Place  2 sprays into both nostrils daily as needed for allergies or rhinitis.     ? megestrol (MEGACE) 40 MG tablet Take 1 tablet (40 mg total) by mouth 2 (two) times daily. 60 tablet 1  ? oxyCODONE-acetaminophen (PERCOCET/ROXICET) 5-325 MG tablet Take 1 tablet by mouth every 6 (six) hours as needed for severe pain. 5 tablet 0  ? prazosin (MINIPRESS) 5 MG capsule Take 5 mg by mouth at bedtime.    ? sertraline (ZOLOFT) 100 MG tablet Take 150 mg by mouth daily.    ? tizanidine (ZANAFLEX) 6 MG capsule TAKE ONE CAPSULE BY MOUTH AS NEEDED FOR HEADACHE EVERY 8 HOURS    ? amphetamine-dextroamphetamine (ADDERALL XR) 20 MG 24 hr capsule Take by mouth.    ? Blood Pressure Monitoring (BLOOD PRESSURE KIT) DEVI 1 kit by Does not apply route once a week. Check Blood Pressure regularly and record readings into the Babys[employer] App.  Large Cuff.  DX O90.0 (Patient taking differently: 1 kit by Does not apply route once a week. Check Blood Pressure regularly and record readings into the  Babyscripts App.  Large Cuff.  DX O90.0) 1 each 0  ? Chlorzoxazone 750 MG TABS Lorzone 750 MG Oral Tablet; RxNorm: 0762263; Chlorzoxazone; 750 MG; Not Indicated; Not Indicated; 1 tablet by mouth 3 times daily as needed    ? clonazePAM (KLONOPIN) 0.5 MG tablet Take 0.5 mg by mouth 2 (two) times daily as needed for anxiety. (Patient not taking: Reported on 04/24/2022)    ? ferrous sulfate 325 (65 FE) MG tablet Take 325 mg by mouth every other day.    ? ibuprofen (ADVIL) 600 MG tablet Take 1 tablet (600 mg total) by mouth every 6 (six) hours as needed. (Patient not taking: Reported on 04/24/2022) 30 tablet 1  ? labetalol (NORMODYNE) 100 MG tablet Take 1 tablet (100 mg total) by mouth 2 (two) times daily. (Patient not taking: Reported on 04/24/2022) 60 tablet 2  ? NYSTATIN PO Take by mouth.    ? prazosin (MINIPRESS) 1 MG capsule Take 1 mg by mouth at bedtime.    ? Prenatal Vit-Fe Fumarate-FA (MULTIVITAMIN-PRENATAL) 27-0.8 MG TABS tablet Take 1 tablet by mouth daily  at 12 noon. (Patient not taking: Reported on 04/24/2022)    ? temazepam (RESTORIL) 15 MG capsule Take 15 mg by mouth at bedtime as needed.    ? tinidazole (TINDAMAX) 250 MG tablet Take by mouth daily with breakfast. (Patient not taking: Reported on 06/07/2021)    ? UBRELVY 100 MG TABS PLEASE SEE ATTACHED FOR DETAILED DIRECTIONS    ? ?No current facility-administered medications for this visit.  ? ? ?Review of Systems ?Review of Systems  ?Constitutional: Negative.   ?Respiratory: Negative.    ?Cardiovascular: Negative.   ?Genitourinary:  Positive for menstrual problem and vaginal bleeding. Negative for vaginal discharge.  ? ?Blood pressure 128/82, pulse 88, height $RemoveBe'5\' 3"'wPcKBbkXT$  (1.6 m), weight 239 lb (108.4 kg), last menstrual period 04/11/2022. ? ?Physical Exam ?Physical Exam ?Vitals and nursing note reviewed. Exam conducted with a chaperone present.  ?Constitutional:   ?   Appearance: She is obese.  ?HENT:  ?   Head: Normocephalic and atraumatic.  ?Cardiovascular:  ?   Rate and Rhythm: Normal rate.  ?Pulmonary:  ?   Effort: Pulmonary effort is normal.  ?Genitourinary: ?   General: Normal vulva.  ?   Exam position: Lithotomy position.  ?   Vagina: Bleeding present.  ?   Cervix: Normal.  ?   Uterus: Normal.   ?   Adnexa: Right adnexa normal and left adnexa normal.  ?Skin: ?   General: Skin is warm and dry.  ?Neurological:  ?   Mental Status: She is alert.  ?Psychiatric:     ?   Mood and Affect: Mood normal.     ?   Behavior: Behavior normal.  ? ? ?Data Reviewed ?CBC ?   ?Component Value Date/Time  ? WBC 7.0 12/26/2017 1517  ? RBC 4.49 12/26/2017 1517  ? HGB 11.8 12/26/2017 1517  ? HCT 34.9 12/26/2017 1517  ? PLT 295 12/26/2017 1517  ? MCV 78 (L) 12/26/2017 1517  ? MCH 26.3 (L) 12/26/2017 1517  ? MCHC 33.8 12/26/2017 1517  ? RDW 16.7 (H) 12/26/2017 1517  ? LYMPHSABS 2.2 12/26/2017 1517  ? EOSABS 0.3 12/26/2017 1517  ? BASOSABS 0.0 12/26/2017 1517  ? ?Narrative & Impression  ?CLINICAL DATA:  Spontaneous abortion. ?   ?EXAM: ?TRANSVAGINAL OB ULTRASOUND ?  ?TECHNIQUE: ?Transvaginal ultrasound was performed for complete evaluation of the ?gestation as well as the maternal uterus, adnexal regions, and ?pelvic  cul-de-sac. ?  ?COMPARISON:  None. ?  ?FINDINGS: ?Intrauterine gestational sac: No ?  ?Yolk sac:  No ?  ?Embryo:  No ?  ?Cardiac Activity: No ?  ?The endometrium is heterogeneous and thickened at 1.4 cm. There is a ?small amount of fluid in the endometrial cavity. ?  ?Maternal uterus/adnexae: The ovaries appear normal. The right ovary ?is 3.0 x 2.8 x 2.0 cm. The left ovary is 2.6 x 2.1 x 1.3 cm. ?  ?The patient has 2 uterine fibroids, 1 measuring 5 x 4 x 4 cm to the ?left of midline and a fundal fibroid measuring 1.6 x 1.5 x 1.6 cm. ?  ?IMPRESSION: ?Negative for intrauterine pregnancy. Thickened slightly ?heterogeneous endometrium with a small amount of fluid in the ?endometrial cavity. ?  ?Uterine fibroids. ?  ?  ?Electronically Signed ?  By: Lorriane Shire M.D. ?  On: 03/05/2020 14:23  ? ? ?Assessment ?DUB (dysfunctional uterine bleeding) - Plan: POCT urine pregnancy, CBC, US PELVIC COMPLETE WITH TRANSVAGINAL, megestrol (MEGACE) 40 MG tablet ? ? ?Plan ?F/u after [place]  ?Meds ordered this encounter  ?Medications  ? megestrol (MEGACE) 40 MG tablet  ?  Sig: Take 1 tablet (40 mg total) by mouth 2 (two) times daily.  ?  Dispense:  60 tablet  ?  Refill:  1  ? ? ? ? ? ?Emeterio Reeve ?04/24/2022, 2:19 PM ? ? ? ?

## 2022-04-24 NOTE — Telephone Encounter (Signed)
TC from pt. Missed checkout and needed info on Pelvic US ordered by Dr. Roselie Awkward. Pt informed of date, time and location of exam and of need to arrive 15 min early with a full bladder. Pt verbalized understanding and confirms availability for 05/08/22 arriving at 2:45 pm at [facility]. ?

## 2022-04-25 ENCOUNTER — Telehealth: Payer: Self-pay | Admitting: *Deleted

## 2022-04-25 LAB — CBC
Hematocrit: 17.1 % — CL (ref 34.0–46.6)
Hemoglobin: 5.3 g/dL — CL (ref 11.1–15.9)
MCH: 24 pg — ABNORMAL LOW (ref 26.6–33.0)
MCHC: 31 g/dL — ABNORMAL LOW (ref 31.5–35.7)
MCV: 77 fL — ABNORMAL LOW (ref 79–97)
Platelets: 345 10*3/uL (ref 150–450)
RBC: 2.21 x10E6/uL — CL (ref 3.77–5.28)
RDW: 19.1 % — ABNORMAL HIGH (ref 11.7–15.4)
WBC: 7.1 10*3/uL (ref 3.4–10.8)

## 2022-04-25 MED ORDER — SODIUM CHLORIDE 0.9 % IV SOLN: 300.0000 mg | INTRAVENOUS | Status: AC

## 2022-04-25 NOTE — Telephone Encounter (Signed)
TC from pt concerned with RX for Megace. Pt "looked it up" and found that it can be used for cancer and became concerned. Reassured pt that medication is prescribed to help with AUB. Encouraged to attend [place] appt and that Dr. Roselie Awkward would review her remaining lab results soon. Pt verbalized understanding. ?

## 2022-04-25 NOTE — Addendum Note (Signed)
Addended by: Woodroe Mode on: 04/25/2022 11:46 AM ? ? Modules accepted: Orders ? ?

## 2022-05-02 ENCOUNTER — Encounter: Payer: Self-pay | Admitting: Obstetrics and Gynecology

## 2022-05-02 ENCOUNTER — Other Ambulatory Visit: Payer: Self-pay | Admitting: Obstetrics and Gynecology

## 2022-05-02 DIAGNOSIS — D500 Iron deficiency anemia secondary to blood loss (chronic): Secondary | ICD-10-CM

## 2022-05-02 DIAGNOSIS — D509 Iron deficiency anemia, unspecified: Secondary | ICD-10-CM | POA: Insufficient documentation

## 2022-05-02 NOTE — Progress Notes (Signed)
Venofer orders placed for Dr. Jordan Hawks patient.  She can be scheduled as needed.  Lynnda Shields, MD

## 2022-05-03 ENCOUNTER — Other Ambulatory Visit: Payer: Self-pay | Admitting: Obstetrics & Gynecology

## 2022-05-03 DIAGNOSIS — D500 Iron deficiency anemia secondary to blood loss (chronic): Secondary | ICD-10-CM

## 2022-05-08 ENCOUNTER — Ambulatory Visit
Admission: RE | Admit: 2022-05-08 | Discharge: 2022-05-08 | Disposition: A | Discharge status: Home / Self Care | Payer: Medicaid Other | Source: Ambulatory Visit | Attending: Obstetrics & Gynecology | Admitting: Obstetrics & Gynecology

## 2022-05-08 DIAGNOSIS — N938 Other specified abnormal uterine and vaginal bleeding: Secondary | ICD-10-CM | POA: Diagnosis present

## 2022-05-11 ENCOUNTER — Ambulatory Visit (INDEPENDENT_AMBULATORY_CARE_PROVIDER_SITE_OTHER): Payer: Medicaid Other

## 2022-05-11 VITALS — BP 116/73 | HR 68 | Temp 98.5°F | Resp 16 | Ht 63.0 in | Wt 242.6 lb

## 2022-05-11 DIAGNOSIS — D500 Iron deficiency anemia secondary to blood loss (chronic): Secondary | ICD-10-CM

## 2022-05-11 DIAGNOSIS — D259 Leiomyoma of uterus, unspecified: Secondary | ICD-10-CM

## 2022-05-11 MED ORDER — ACETAMINOPHEN 325 MG PO TABS
650.0000 mg | ORAL_TABLET | Freq: Once | ORAL | Status: AC
  Administered 2022-05-11: 650 mg via ORAL
  Filled 2022-05-11: qty 2

## 2022-05-11 MED ORDER — DIPHENHYDRAMINE HCL 25 MG PO CAPS
25.0000 mg | ORAL_CAPSULE | Freq: Once | ORAL | Status: AC
  Administered 2022-05-11: 25 mg via ORAL
  Filled 2022-05-11: qty 1

## 2022-05-11 MED ORDER — SODIUM CHLORIDE 0.9 % IV SOLN
500.0000 mg | Freq: Once | INTRAVENOUS | Status: AC
  Administered 2022-05-11: 500 mg via INTRAVENOUS
  Filled 2022-05-11: qty 25

## 2022-05-11 NOTE — Progress Notes (Signed)
Diagnosis: Iron Deficiency Anemia  Provider:  Marshell Garfinkel, MD  Procedure: Infusion  IV Type: Peripheral, IV Location: L Hand  Venofer (Iron Sucrose), Dose: 500 mg  Infusion Start Time: 0929  Infusion Stop Time: 7824  Post Infusion IV Care: Declined observation, iv discontinued  Discharge: Condition: Good, Destination: Home . AVS provided to patient.   Performed by:  Arnoldo Morale, RN

## 2022-05-17 ENCOUNTER — Other Ambulatory Visit: Payer: Self-pay | Admitting: Obstetrics & Gynecology

## 2022-05-17 DIAGNOSIS — N938 Other specified abnormal uterine and vaginal bleeding: Secondary | ICD-10-CM

## 2022-05-25 ENCOUNTER — Ambulatory Visit (INDEPENDENT_AMBULATORY_CARE_PROVIDER_SITE_OTHER): Payer: Medicaid Other

## 2022-05-25 VITALS — BP 148/81 | HR 69 | Temp 98.9°F | Resp 18 | Ht 63.0 in | Wt 241.1 lb

## 2022-05-25 DIAGNOSIS — D259 Leiomyoma of uterus, unspecified: Secondary | ICD-10-CM | POA: Diagnosis not present

## 2022-05-25 DIAGNOSIS — D500 Iron deficiency anemia secondary to blood loss (chronic): Secondary | ICD-10-CM

## 2022-05-25 MED ORDER — ACETAMINOPHEN 325 MG PO TABS
650.0000 mg | ORAL_TABLET | Freq: Once | ORAL | Status: AC
  Administered 2022-05-25: 650 mg via ORAL
  Filled 2022-05-25: qty 2

## 2022-05-25 MED ORDER — DIPHENHYDRAMINE HCL 25 MG PO CAPS
25.0000 mg | ORAL_CAPSULE | Freq: Once | ORAL | Status: AC
  Administered 2022-05-25: 25 mg via ORAL
  Filled 2022-05-25: qty 1

## 2022-05-25 MED ORDER — SODIUM CHLORIDE 0.9 % IV SOLN
500.0000 mg | Freq: Once | INTRAVENOUS | Status: AC
  Administered 2022-05-25: 500 mg via INTRAVENOUS
  Filled 2022-05-25: qty 25

## 2022-05-25 NOTE — Progress Notes (Signed)
Diagnosis: Iron Deficiency Anemia  Provider:  Marshell Garfinkel, MD  Procedure: Infusion  IV Type: Peripheral, IV Location: L Antecubital  Venofer (Iron Sucrose), Dose: '500mg'$   Infusion Start Time: 1518  Infusion Stop Time: 3437  Post Infusion IV Care: Peripheral IV Discontinued  Discharge: Condition: Good, Destination: Home . AVS provided to patient.   Performed by:  Koren Shiver, RN

## 2022-05-25 NOTE — Patient Instructions (Signed)
Excuse from Work, School, or Physical Activity ?_______________________________________________________ needs to be excused from: ?____ Work. ?____ School. ?____ Physical activity. ?This is effective for the following dates: ______________________________________. ?He or she may return to work or school, but should avoid physical activity or other activities from now until _______________. ?Activity restrictions include: ?____ Lifting more than _________ lb / kg. ?____ Sitting longer than __________ minutes at a time. ?____ Standing longer than ________ minutes at a time. ?____ Other activities including: ___________________________________________________________________________________ ?____ He or she may return to full physical activity on ________________. ?Health care provider name (printed): _____________________________________________________ ?Health care provider (signature): _________________________________________________________ ?Date: ______________________________ ?This information is not intended to replace advice given to you by your health care provider. Make sure you discuss any questions you have with your health care provider. ?Document Revised: 08/16/2021 Document Reviewed: 08/16/2021 ?Elsevier Patient Education ? 2023 Elsevier Inc. ? ?

## 2022-06-18 ENCOUNTER — Ambulatory Visit (INDEPENDENT_AMBULATORY_CARE_PROVIDER_SITE_OTHER): Payer: Medicaid Other | Admitting: Obstetrics & Gynecology

## 2022-06-18 VITALS — BP 128/84 | HR 66 | Wt 248.0 lb

## 2022-06-18 DIAGNOSIS — D500 Iron deficiency anemia secondary to blood loss (chronic): Secondary | ICD-10-CM

## 2022-06-18 NOTE — Progress Notes (Unsigned)
Patient ID: Sheri Roberts, female   DOB: 07/23/1976, 46 y.o.   MRN: [id number]  Chief Complaint  Patient presents with   Follow-up   [id number]  HPI Sheri Roberts is a 46 y.o. female.  No LMP recorded. [place] was repeated and was stable with fibroids. No abnormal bleeding now and she wishes to have expectant management as she would like to conceive if possible HPI  Past Medical History:  Diagnosis Date   Anemia    Anxiety    Arthritis    Cyst of eye    Depression    Endometriosis of uterus    Headache    Hypertension    Idiopathic neuropathy    Morbid obesity ([place])    Neuralgia    Sickle cell trait ([place])     Past Surgical History:  Procedure Laterality Date   ANKLE SURGERY Right March 26 2015   seven screws put in left ankle due to a fall   spinal tap      Family History  Adopted: Yes  Problem Relation Age of Onset   Stroke Mother 65   Hypertension Mother    Hypertension Father    Diabetes Other    Glaucoma Other    Hypertension Other    Mental illness Sister    Mental retardation Sister     Social History Social History   Tobacco Use   Smoking status: Former    Packs/day: 0.10    Years: 15.00    Total pack years: 1.50    Types: Cigarettes   Smokeless tobacco: Never  Vaping Use   Vaping Use: Never used  Substance Use Topics   Alcohol use: No   Drug use: No    Allergies  Allergen Reactions   Bee Venom Swelling   Penicillins Hives   Sulfa Antibiotics Hives    Current Outpatient Medications  Medication Sig Dispense Refill   amphetamine-dextroamphetamine (ADDERALL XR) 20 MG 24 hr capsule Take by mouth.     Blood Pressure Monitoring (BLOOD PRESSURE KIT) DEVI 1 kit by Does not apply route once a week. Check Blood Pressure regularly and record readings into the Babys[employer]s App.  Large Cuff.  DX O90.0 (Patient taking differently: 1 kit by Does not apply route once a week. Check Blood Pressure regularly and record readings into the Babyscripts App.  Large  Cuff.  DX O90.0) 1 each 0   cariprazine (VRAYLAR) capsule Take by mouth.     cetirizine (ZYRTEC) 10 MG tablet Take 1 tablet (10 mg total) by mouth daily. 90 tablet 4   Chlorzoxazone 750 MG TABS Lorzone 750 MG Oral Tablet; RxNorm: 3254982; Chlorzoxazone; 750 MG; Not Indicated; Not Indicated; 1 tablet by mouth 3 times daily as needed     clonazePAM (KLONOPIN) 0.5 MG tablet Take 0.5 mg by mouth 2 (two) times daily as needed for anxiety. (Patient not taking: Reported on 04/24/2022)     diclofenac Sodium (VOLTAREN) 1 % GEL Apply topically.     EPINEPHrine 0.3 mg/0.3 mL IJ SOAJ injection Inject into the muscle once.     Erenumab-aooe (AIMOVIG) 70 MG/ML SOAJ Inject into the skin.     ferrous sulfate 325 (65 FE) MG tablet Take 325 mg by mouth every other day.     fluticasone (FLONASE) 50 MCG/ACT nasal spray Place 2 sprays into both nostrils daily as needed for allergies or rhinitis.      ibuprofen (ADVIL) 600 MG tablet Take 1 tablet (600 mg total) by mouth every 6 (six) hours  as needed. (Patient not taking: Reported on 04/24/2022) 30 tablet 1   labetalol (NORMODYNE) 100 MG tablet Take 1 tablet (100 mg total) by mouth 2 (two) times daily. (Patient not taking: Reported on 04/24/2022) 60 tablet 2   megestrol (MEGACE) 40 MG tablet Take 1 tablet (40 mg total) by mouth 2 (two) times daily. 60 tablet 1   NYSTATIN PO Take by mouth.     oxyCODONE-acetaminophen (PERCOCET/ROXICET) 5-325 MG tablet Take 1 tablet by mouth every 6 (six) hours as needed for severe pain. 5 tablet 0   prazosin (MINIPRESS) 1 MG capsule Take 1 mg by mouth at bedtime.     prazosin (MINIPRESS) 5 MG capsule Take 5 mg by mouth at bedtime.     Prenatal Vit-Fe Fumarate-FA (MULTIVITAMIN-PRENATAL) 27-0.8 MG TABS tablet Take 1 tablet by mouth daily at 12 noon. (Patient not taking: Reported on 04/24/2022)     sertraline (ZOLOFT) 100 MG tablet Take 150 mg by mouth daily.     temazepam (RESTORIL) 15 MG capsule Take 15 mg by mouth at bedtime as needed.      tinidazole (TINDAMAX) 250 MG tablet Take by mouth daily with breakfast. (Patient not taking: Reported on 06/07/2021)     tizanidine (ZANAFLEX) 6 MG capsule TAKE ONE CAPSULE BY MOUTH AS NEEDED FOR HEADACHE EVERY 8 HOURS     UBRELVY 100 MG TABS PLEASE SEE ATTACHED FOR DETAILED DIRECTIONS     No current facility-administered medications for this visit.    Review of Systems Review of Systems  Constitutional: Negative.   Respiratory: Negative.    Cardiovascular: Negative.   Gastrointestinal: Negative.   Genitourinary:  Negative for pelvic pain, vaginal bleeding and vaginal discharge.    Blood pressure 128/84, pulse 66, weight 248 lb (112.5 kg).  Physical Exam Physical Exam Vitals and nursing note reviewed.  Constitutional:      Appearance: Normal appearance.  Cardiovascular:     Rate and Rhythm: Normal rate.  Pulmonary:     Effort: Pulmonary effort is normal.  Musculoskeletal:     Cervical back: Normal range of motion.  Skin:    General: Skin is warm and dry.  Neurological:     General: No focal deficit present.     Mental Status: She is alert and oriented to person, place, and time.  Psychiatric:        Mood and Affect: Mood normal.        Behavior: Behavior normal.     Data Reviewed Narrative & Impression  CLINICAL DATA:  Dysfunctional uterine bleeding   EXAM: TRANSABDOMINAL AND TRANSVAGINAL ULTRASOUND OF PELVIS   TECHNIQUE: Both transabdominal and transvaginal ultrasound examinations of the pelvis were performed. Transabdominal technique was performed for global imaging of the pelvis including uterus, ovaries, adnexal regions, and pelvic cul-de-sac. It was necessary to proceed with endovaginal exam following the transabdominal exam to visualize the endometrium and ovaries.   COMPARISON:  None Available.   FINDINGS: Uterus   Measurements: 12.6 x 5.7 x 7.2 cm = volume: 271 mL. There is inhomogeneous echogenicity in myometrium. There is 5.1 x 5.5 x 4.3 cm  fibroid in the left side of body. There is 2 x 1.5 x 2.1 cm submucosal fibroid in the fundus.   Endometrium   Thickness: Endometrial stripe is difficult to evaluate due to inhomogeneous echogenicity in the myometrium. Endometrial stripe possibly measures 2.8 cm in diameter. There is no increased vascularity in the endometrium in color Doppler examination.   Right ovary   Not sonographically visualized.  Left ovary   Not sonographically visualized.   Other findings   No abnormal free fluid.   IMPRESSION: There is inhomogeneous echogenicity in myometrium with multiple fibroids.   Evaluation of endometrium was technically difficult. Endometrial stripe is possibly thickened measuring 2.8 cm. There is no increased vascularity in the endometrium in color Doppler examination. Short-term follow-up pelvic sonogram in 1-2 months and sonohysterogram if warranted may be considered.   Ovaries are not sonographically visualized. There are no dominant adnexal masses.     Electronically Signed   By: Elmer Picker M.D.   On: 05/08/2022 15:52    Assessment Iron deficiency anemia due to chronic blood loss - Plan: CANCELED: CBC No DUB and patient has iron supplement. Will f/u with PCP prn for blood work  Plan RTC prn for DUB or menorrhagia    Emeterio Reeve 06/18/2022, 1:50 PM

## 2022-06-22 ENCOUNTER — Telehealth: Payer: Self-pay

## 2022-06-22 NOTE — Telephone Encounter (Signed)
Returned call and answered patient medication questions

## 2022-06-25 ENCOUNTER — Other Ambulatory Visit: Payer: Self-pay | Admitting: *Deleted

## 2022-06-25 DIAGNOSIS — N938 Other specified abnormal uterine and vaginal bleeding: Secondary | ICD-10-CM

## 2022-06-25 MED ORDER — MEGESTROL ACETATE 40 MG PO TABS: 40.0000 mg | ORAL_TABLET | Freq: Two times a day (BID) | ORAL | 1 refills | Status: DC

## 2022-06-25 NOTE — Progress Notes (Signed)
RX Megace, 2 refills sent per v.o. Dr. Roselie Awkward for pt report of intermittent bleeding with large clots. Pt advised to follow up.

## 2022-06-29 IMAGING — US US PELVIS COMPLETE WITH TRANSVAGINAL
1 series · 15 of 25 positions shown · non-contrast
Comparison: None Available.

CLINICAL DATA: Dysfunctional uterine bleeding

EXAM:
TRANSABDOMINAL AND TRANSVAGINAL ULTRASOUND OF PELVIS
TECHNIQUE: Both transabdominal and transvaginal ultrasound examinations of the
pelvis were performed. Transabdominal technique was performed for
global imaging of the pelvis including uterus, ovaries, adnexal
regions, and pelvic cul-de-sac. It was necessary to proceed with
endovaginal exam following the transabdominal exam to visualize the
endometrium and ovaries.

[Series 1: us pelvis complete · 15 of 50 slices shown]
[im 1/50]
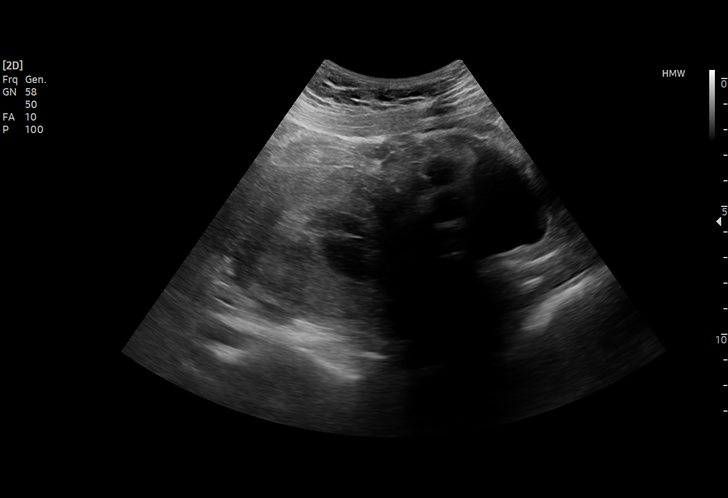
[im 5/50]
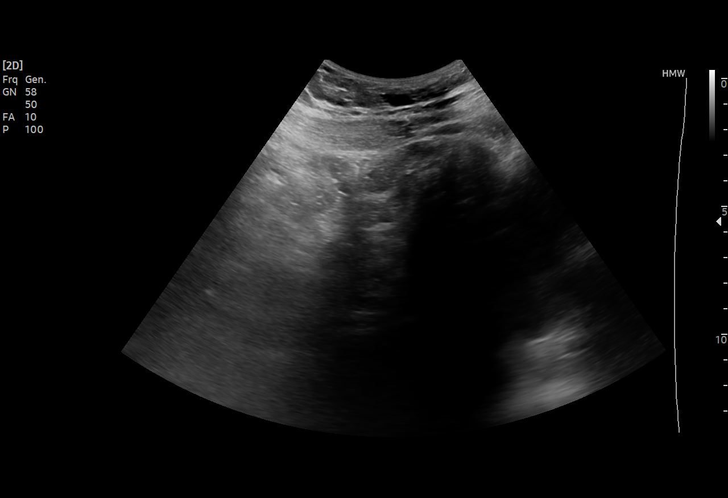
[im 9/50]
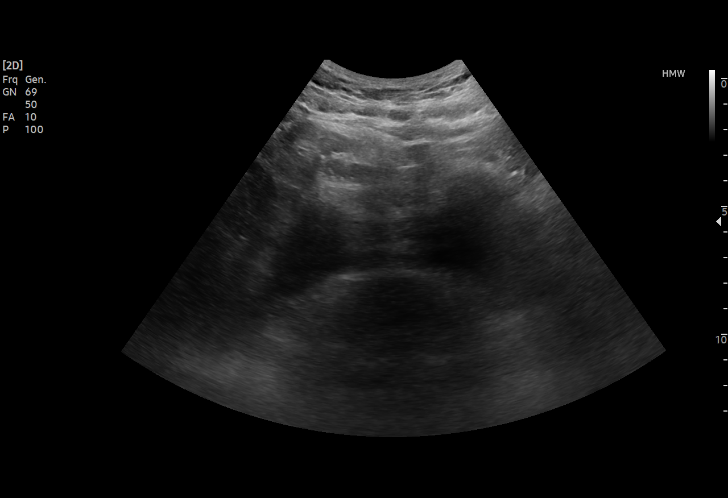
[im 11/50]
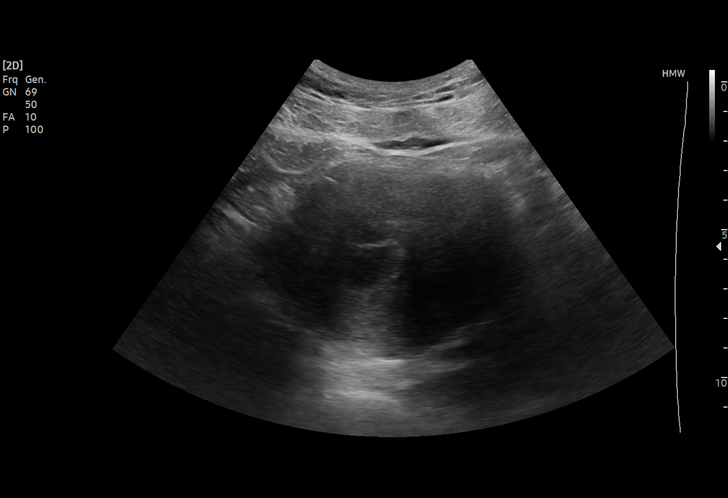
[im 15/50]
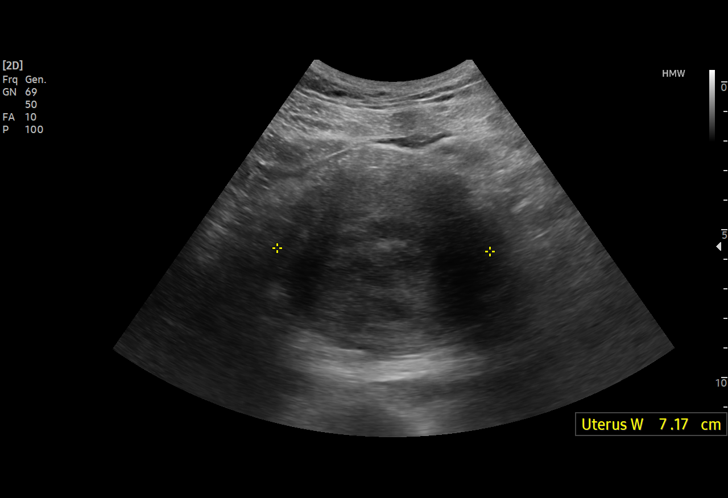
[im 19/50]
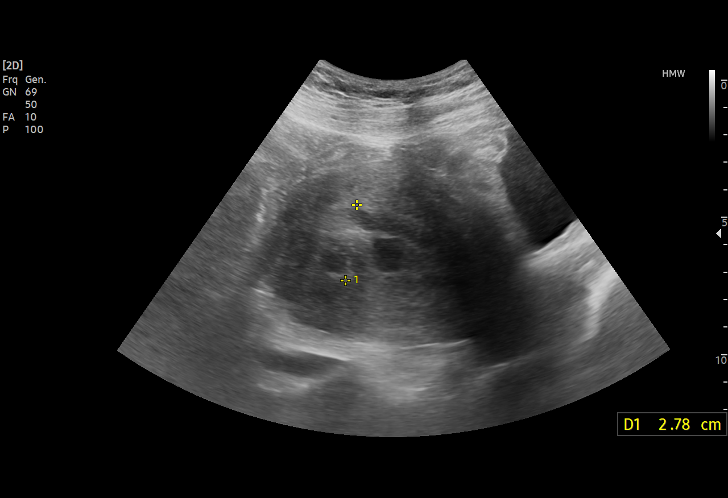
[im 21/50]
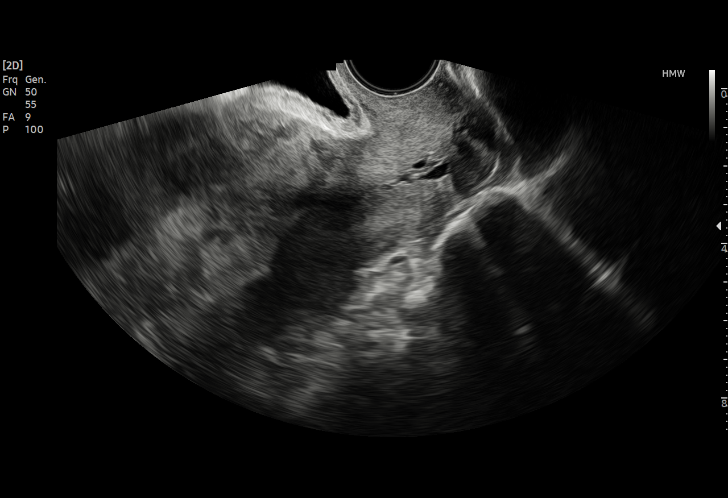
[im 25/50]
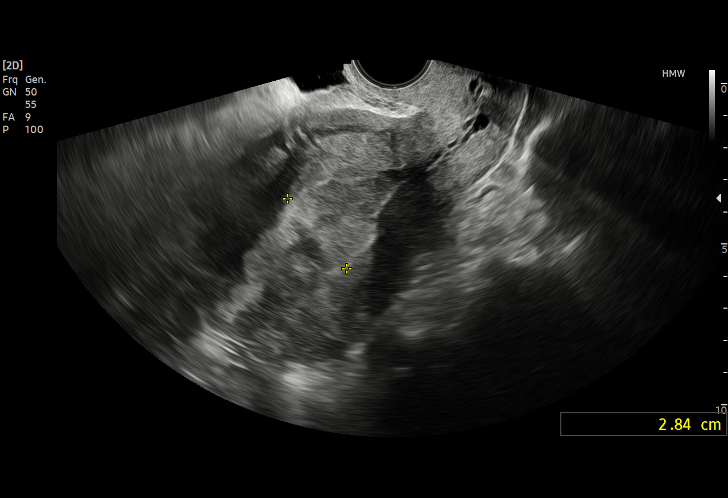
[im 29/50]
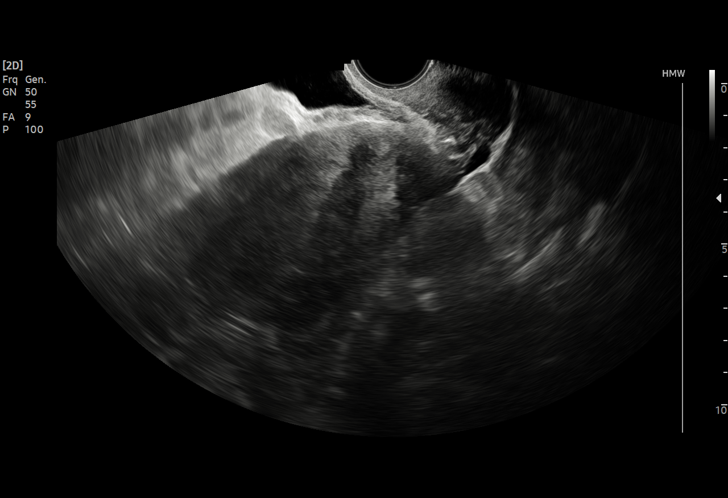
[im 31/50]
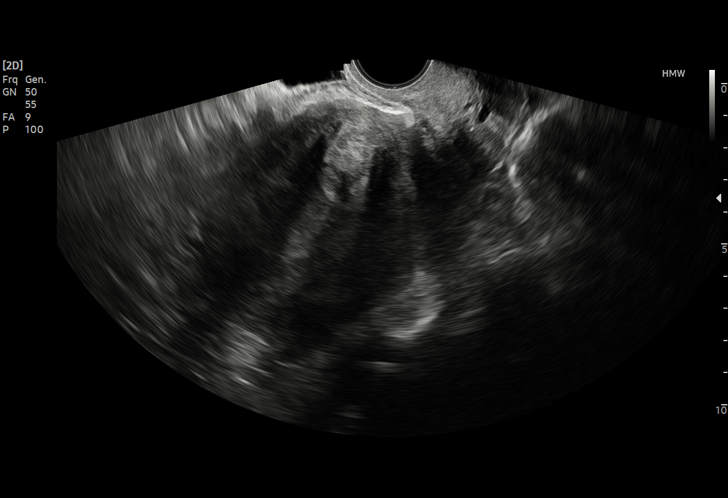
[im 35/50]
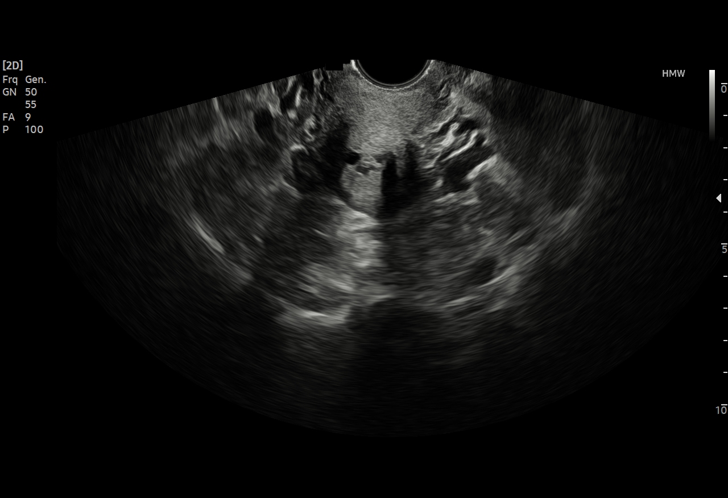
[im 39/50]
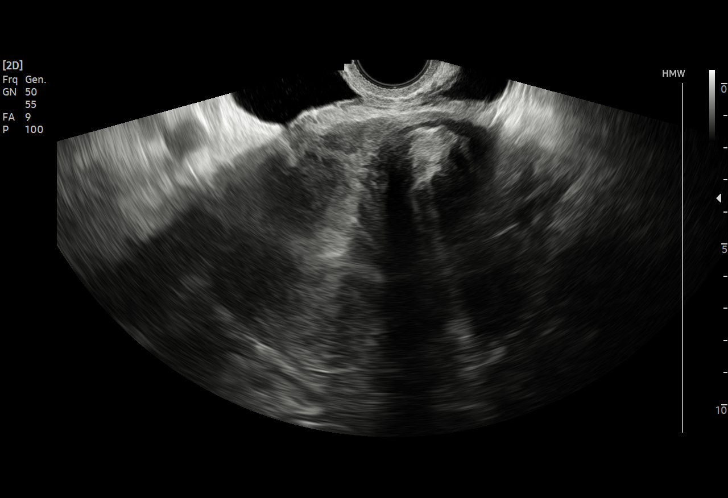
[im 41/50]
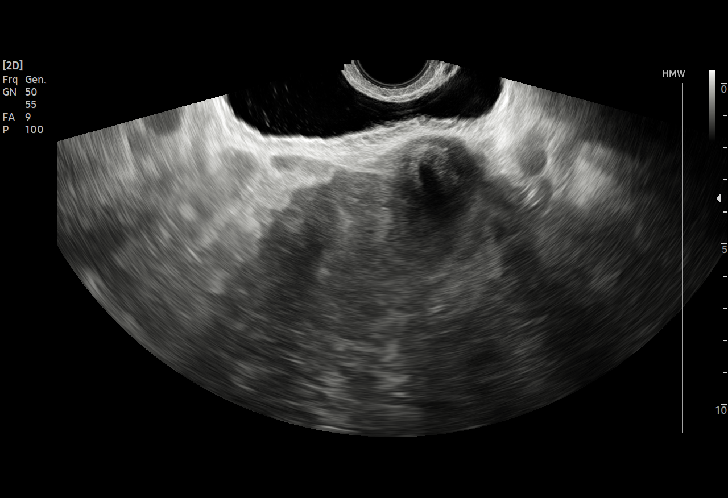
[im 45/50]
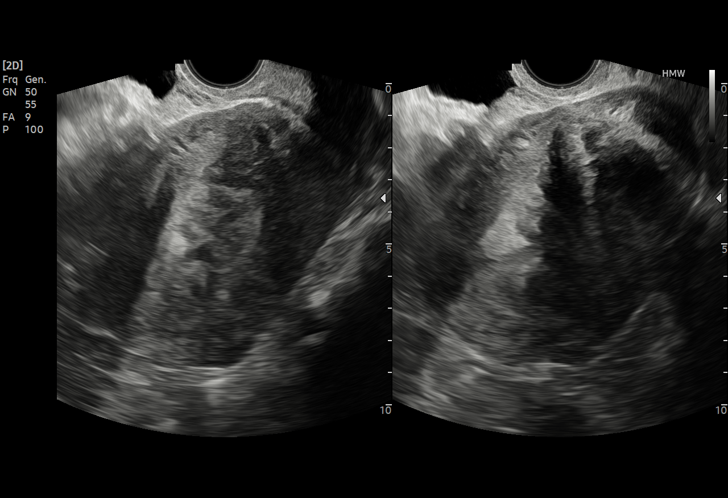
[im 50/50]
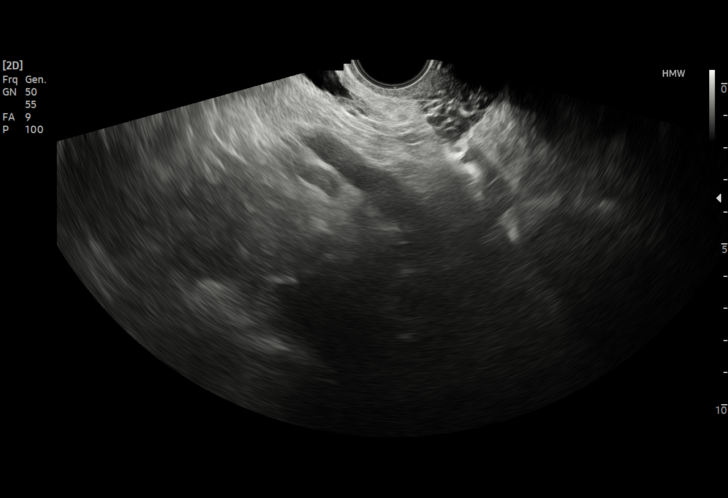

[15 of 25 positions shown; findings below may reference images not displayed]

FINDINGS: Uterus

Measurements: 12.6 x 5.7 x 7.2 cm = volume: 271 mL. There is
inhomogeneous echogenicity in myometrium. There is 5.1 x 5.5 x
cm fibroid in the left side of body. There is 2 x 1.5 x 2.1 cm
submucosal fibroid in the fundus.

Endometrium

Thickness: Endometrial stripe is difficult to evaluate due to
inhomogeneous echogenicity in the myometrium. Endometrial stripe
possibly measures 2.8 cm in diameter. There is no increased
vascularity in the endometrium in color Doppler examination.

Right ovary

Not sonographically visualized.

Left ovary

Not sonographically visualized.

Other findings

No abnormal free fluid.
IMPRESSION: There is inhomogeneous echogenicity in myometrium with multiple
fibroids.

Evaluation of endometrium was technically difficult. Endometrial
stripe is possibly thickened measuring 2.8 cm. There is no increased
vascularity in the endometrium in color Doppler examination.
Short-term follow-up pelvic sonogram in 1-2 months and
sonohysterogram if warranted may be considered.

Ovaries are not sonographically visualized. There are no dominant
adnexal masses.

## 2022-07-02 ENCOUNTER — Telehealth: Payer: Self-pay | Admitting: Emergency Medicine

## 2022-07-02 NOTE — Telephone Encounter (Signed)
Attempted RC to patient. LVM to call office

## 2022-07-02 NOTE — Telephone Encounter (Signed)
TC from patient who reports continued vaginal bleeding with Megace. Wants to discuss next steps, such as fibroid removal. Message sent to provider Roselie Awkward, MD) to advise next steps. Awaiting provider response, pt aware.

## 2022-07-02 NOTE — Telephone Encounter (Signed)
Per Roselie Awkward, MD, patient should schedule follow up. Pt transferred to scheduler to make appointment.

## 2022-07-09 ENCOUNTER — Encounter: Payer: Self-pay | Admitting: Obstetrics and Gynecology

## 2022-07-09 ENCOUNTER — Ambulatory Visit (INDEPENDENT_AMBULATORY_CARE_PROVIDER_SITE_OTHER): Payer: Medicaid Other | Admitting: Obstetrics and Gynecology

## 2022-07-09 VITALS — BP 132/84 | HR 82 | Ht 63.0 in | Wt 244.0 lb

## 2022-07-09 DIAGNOSIS — D259 Leiomyoma of uterus, unspecified: Secondary | ICD-10-CM

## 2022-07-09 DIAGNOSIS — N920 Excessive and frequent menstruation with regular cycle: Secondary | ICD-10-CM

## 2022-07-09 MED ORDER — TRANEXAMIC ACID 650 MG PO TABS: 1300.0000 mg | ORAL_TABLET | Freq: Three times a day (TID) | ORAL | 5 refills | Status: DC

## 2022-07-09 NOTE — Progress Notes (Signed)
Pt presents for f/u AUB.  Heavy bleeding with cramping x 3 weeks No relief with Megace Negative pap at [facility] April or May 2023 per pt - ROI signed last visit per pt

## 2022-07-09 NOTE — Progress Notes (Signed)
46 yo P4 with menorrhagia with regular cycle presenting today for evaluation of heavy menses. Patient with known fibroid uterus and opted for medical management with megace. She reports megace is not helping anymore. Patient desires to conceive. She is without any other complaints  Past Medical History:  Diagnosis Date   Anemia    Anxiety    Arthritis    Cyst of eye    Depression    Endometriosis of uterus    Headache    Hypertension    Idiopathic neuropathy    Morbid obesity ([facility])    Neuralgia    Sickle cell trait ([facility])    Past Surgical History:  Procedure Laterality Date   ANKLE SURGERY Right March 26 2015   seven screws put in left ankle due to a fall   spinal tap     Family History  Adopted: Yes  Problem Relation Age of Onset   Stroke Mother 39   Hypertension Mother    Hypertension Father    Diabetes Other    Glaucoma Other    Hypertension Other    Mental illness Sister    Mental retardation Sister    Social History   Tobacco Use   Smoking status: Every Day    Packs/day: 0.10    Years: 15.00    Total pack years: 1.50    Types: Cigarettes    Passive exposure: Current   Smokeless tobacco: Never  Vaping Use   Vaping Use: Never used  Substance Use Topics   Alcohol use: No   Drug use: No   ROS See pertinent in HPI. All other systems reviewed and non contributory Blood pressure 132/84, pulse 82, height '5\' 3"'$  (1.6 m), weight 244 lb (110.7 kg). GENERAL: Well-developed, well-nourished female in no acute distress.  NEURO: alert and oriented x 3  A/P 46 yo with menorrhagia with regular cycles - Rx Lysteda provided - Patient referred to Dr. Elgie Congo and Dr. Sabra Heck for [facility] consultation - RTC with Dr. Roselie Awkward for further management if not a [facility] candidate

## 2022-07-18 ENCOUNTER — Telehealth: Payer: Self-pay | Admitting: *Deleted

## 2022-07-18 NOTE — Telephone Encounter (Signed)
Pt called to office stating she has been taking Lysteda as prescribed and has had noticeable changes in her sexual health.  Pt states she has not been able to climax with intercourse since starting medication.  Pt would like to know if this may be related or what she can do to correct this.   Pt made aware msg to provider for review and recommendations.    Please advise.

## 2022-07-19 NOTE — Telephone Encounter (Signed)
Osborne Oman, MD  Valene Bors, CMA Caller: Unspecified Wilburn Mylar, 11:36 AM) This is Dr. Harolyn Rutherford covering for Dr. Elly Modena who is on vacation.   This is not a known side effect of the medication, and this is only taken for five days in a cycle.   I do not think this is related, but women can have rare reactions to medications.  But this has no hormones, it is very unlikely.   Verita Schneiders, MD     07/19/22- call placed to pt and made aware of provider review.  Pt will follow up in office as needed or if bleeding continues.

## 2022-08-01 ENCOUNTER — Ambulatory Visit: Payer: Medicaid Other | Admitting: Obstetrics & Gynecology

## 2022-08-27 ENCOUNTER — Institutional Professional Consult (permissible substitution): Payer: Medicaid Other | Admitting: Obstetrics and Gynecology

## 2022-09-03 ENCOUNTER — Institutional Professional Consult (permissible substitution): Payer: Medicaid Other | Admitting: Obstetrics and Gynecology

## 2022-10-30 DIAGNOSIS — J209 Acute bronchitis, unspecified: Secondary | ICD-10-CM | POA: Diagnosis not present

## 2022-11-06 DIAGNOSIS — Z720 Tobacco use: Secondary | ICD-10-CM | POA: Diagnosis not present

## 2022-11-06 DIAGNOSIS — G4733 Obstructive sleep apnea (adult) (pediatric): Secondary | ICD-10-CM | POA: Diagnosis not present

## 2022-11-06 DIAGNOSIS — R1011 Right upper quadrant pain: Secondary | ICD-10-CM | POA: Diagnosis not present

## 2022-11-06 DIAGNOSIS — K8020 Calculus of gallbladder without cholecystitis without obstruction: Secondary | ICD-10-CM | POA: Diagnosis not present

## 2022-11-15 DIAGNOSIS — I498 Other specified cardiac arrhythmias: Secondary | ICD-10-CM | POA: Diagnosis not present

## 2022-11-15 DIAGNOSIS — K8020 Calculus of gallbladder without cholecystitis without obstruction: Secondary | ICD-10-CM | POA: Diagnosis not present

## 2022-11-15 DIAGNOSIS — Z01810 Encounter for preprocedural cardiovascular examination: Secondary | ICD-10-CM | POA: Diagnosis not present

## 2022-11-16 DIAGNOSIS — K8020 Calculus of gallbladder without cholecystitis without obstruction: Secondary | ICD-10-CM | POA: Diagnosis not present

## 2022-11-16 DIAGNOSIS — K8010 Calculus of gallbladder with chronic cholecystitis without obstruction: Secondary | ICD-10-CM | POA: Diagnosis not present

## 2022-11-23 DIAGNOSIS — Z79899 Other long term (current) drug therapy: Secondary | ICD-10-CM | POA: Diagnosis not present

## 2023-01-24 ENCOUNTER — Ambulatory Visit: Payer: Medicaid Other | Admitting: Podiatry

## 2023-02-08 ENCOUNTER — Ambulatory Visit: Payer: Medicaid Other | Admitting: Podiatry

## 2023-02-27 ENCOUNTER — Ambulatory Visit: Payer: Medicaid Other | Admitting: Podiatry

## 2023-02-27 DIAGNOSIS — B353 Tinea pedis: Secondary | ICD-10-CM

## 2023-02-27 MED ORDER — TERBINAFINE HCL 250 MG PO TABS: 250.0000 mg | ORAL_TABLET | Freq: Every day | ORAL | 0 refills | Status: AC

## 2023-02-27 MED ORDER — CLOTRIMAZOLE-BETAMETHASONE 1-0.05 % EX CREA: 1.0000 | TOPICAL_CREAM | Freq: Two times a day (BID) | CUTANEOUS | 3 refills | Status: DC

## 2023-02-27 NOTE — Progress Notes (Signed)
   Chief Complaint  Patient presents with   Foot Problem    Patient came in today for blisters that pop up at night, itching burning, which started 2 months ago    HPI: 47 y.o. female presenting today as a new patient for evaluation of itching and burning sensation to the bilateral feet over the past 2 months.  Most symptomatic in the evenings.  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  She currently takes Lyrica 75 mg 2 times daily from her pain management physician.  She presents for further treatment and evaluation  Past Medical History:  Diagnosis Date   Anemia    Anxiety    Arthritis    Cyst of eye    Depression    Endometriosis of uterus    Headache    Hypertension    Idiopathic neuropathy    Morbid obesity ([place])    Neuralgia    Sickle cell trait [facility])     Past Surgical History:  Procedure Laterality Date   ANKLE SURGERY Right March 26 2015   seven screws put in left ankle due to a fall   spinal tap      Allergies  Allergen Reactions   Bee Venom Swelling   Penicillins Hives   Sulfa Antibiotics Hives     Physical Exam: General: The patient is alert and oriented x3 in no acute distress.  Dermatology: Skin is warm, dry and supple bilateral lower extremities. Negative for open lesions or macerations.  There is some diffuse hyperkeratotic skin with peeling noted to the plantar aspect of bilateral forefoot and interdigital areas.  Patient notices pruritus as well especially at night  Vascular: Palpable pedal pulses bilaterally. Capillary refill within normal limits.  Negative for any significant edema or erythema  Neurological: Grossly intact via light touch  Musculoskeletal Exam: No pedal deformities noted   Assessment: 1.  Lumbar radiculopathy bilateral lower extremities; currently managed with pain management 2.   Acute tinea pedis x 2 months bilateral feet  -Patient evaluated. -Continue Lyrica 75 mg BID as per pain management physician -I do believe the patient is dealing with acute onset of tinea pedis to the bilateral feet. 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 Edrick Kins, DPM    [address], [place] [address]                Office [phone]  Fax [phone]

## 2023-06-05 ENCOUNTER — Other Ambulatory Visit: Payer: Self-pay | Admitting: Podiatry

## 2023-08-23 ENCOUNTER — Other Ambulatory Visit: Payer: Self-pay | Admitting: Podiatry

## 2023-10-09 ENCOUNTER — Encounter: Payer: Self-pay | Admitting: Endocrinology

## 2023-10-09 ENCOUNTER — Ambulatory Visit (INDEPENDENT_AMBULATORY_CARE_PROVIDER_SITE_OTHER): Payer: Medicaid Other | Admitting: Endocrinology

## 2023-10-09 VITALS — BP 136/80 | HR 66 | Resp 20 | Ht 63.0 in | Wt 242.6 lb

## 2023-10-09 DIAGNOSIS — R7989 Other specified abnormal findings of blood chemistry: Secondary | ICD-10-CM | POA: Diagnosis not present

## 2023-10-09 LAB — T4, FREE: Free T4: 0.79 ng/dL (ref 0.60–1.60)

## 2023-10-09 LAB — TSH: TSH: 5.03 u[IU]/mL (ref 0.35–5.50)

## 2023-10-09 NOTE — Progress Notes (Addendum)
Outpatient Endocrinology Note [place] Verna Desrocher, MD  10/09/23  Patient's Name: Sheri Roberts    DOB: Jan 03, 1976    MRN: [id number]  REASON OF VISIT: New consult for elevated TSH  REFERRING PROVIDER: Maye Hides, PA  PCP: Maye Hides, PA  HISTORY OF PRESENT ILLNESS:   Sheri Roberts is a 47 y.o. old female with past medical history as listed below is presented for a evaluation of elevated TSH.   Pertinent Thyroid History: - Patient was seen by primary care provider in August 2024 and found to have elevated TSH and referred to endocrinology for further evaluation and management.  Referral medical records scanned in media reviewed.  No result of TSH available to review on the referral record.  It was mentioned that she had elevated TSH and there was a plan for ultrasound thyroid.  Patient reports she did not have ultrasound thyroid yet.  Patient had normal TSH of 1.750 in January 2019 and normal TSH of 3.370 in May 2021 checked in [facility] health as per review in Care Everywhere.  Patient has never been on thyroid medication.  Patient complains of tiredness however she also has obstructive sleep apnea and she is having issues with CPAP machine.  She denies constipation or change in bowel habit.  She denies cold intolerance.  She has more of heat intolerance.  She has mild dry skin.  She has a regular menstrual cycle.  Patient has no neck compressive symptoms, lump or any dysphagia.  Patient has chronic pain syndrome taking opiates, she reports she has anxiety/depression and bipolar disorder.  She has obstructive sleep apnea using CPAP.   Interval history 10/09/23 Patient presented for evaluation of elevated TSH.  REVIEW OF SYSTEMS:  As per history of present illness.   PAST MEDICAL HISTORY: Past Medical History:  Diagnosis Date   Anemia    Anxiety    Arthritis    Cyst of eye    Depression    Endometriosis of uterus    Headache    Hypertension    Idiopathic neuropathy     Morbid obesity ([facility])    Neuralgia    Sickle cell trait ([facility])     PAST SURGICAL HISTORY: Past Surgical History:  Procedure Laterality Date   ANKLE SURGERY Right March 26 2015   seven screws put in left ankle due to a fall   spinal tap      ALLERGIES: Allergies  Allergen Reactions   Bee Venom Swelling   Penicillins Hives   Sulfa Antibiotics Hives    FAMILY HISTORY:  Family History  Adopted: Yes  Problem Relation Age of Onset   Stroke Mother 66   Hypertension Mother    Hypertension Father    Diabetes Other    Glaucoma Other    Hypertension Other    Mental illness Sister    Mental retardation Sister     SOCIAL HISTORY: Social History   Socioeconomic History   Marital status: Married    Spouse name: Not on file   Number of children: Not on file   Years of education: Not on file   Highest education level: Not on file  Occupational History   Occupation: [employer]  Tobacco Use   Smoking status: Every Day    Current packs/day: 0.10    Average packs/day: 0.1 packs/day for 15.0 years (1.5 ttl pk-yrs)    Types: Cigarettes    Passive exposure: Current   Smokeless tobacco: Never  Vaping Use  Vaping status: Never Used  Substance and Sexual Activity   Alcohol use: No   Drug use: No   Sexual activity: Yes    Partners: Male    Birth control/protection: None  Other Topics Concern   Not on file  Social History Narrative   Not on file   Social Determinants of Health   Financial Resource Strain: Low Risk  (07/20/2022)   Received from [facility], [facility]   Overall Financial Resource Strain (CARDIA)    Difficulty of Paying Living Expenses: Not very hard  Food Insecurity: No Food Insecurity (07/20/2022)   Received from [facility], [facility]   Hunger Vital Sign    Worried About Running Out of Food in the Last Year: Never true    Ran Out of Food in the Last Year: Never true  Transportation Needs: No Transportation Needs  (06/19/2022)   Received from [facility]   PRAPARE - Transportation  Physical Activity: Inactive (01/23/2022)   Received from [facility], [facility]   Exercise Vital Sign    Days of Exercise per Week: 0 days    Minutes of Exercise per Session: 0 min  Stress: Stress Concern Present (01/23/2022)   Received from [facility], [facility] - Occupational Stress Questionnaire    Feeling of Stress : Very much  Social Connections: Unknown (04/09/2022)   Received from [facility], [facility]   Social Network    Social Network: Not on file    MEDICATIONS:  Current Outpatient Medications  Medication Sig Dispense Refill   amphetamine-dextroamphetamine (ADDERALL XR) 20 MG 24 hr capsule Take by mouth.     Blood Pressure Monitoring (BLOOD PRESSURE KIT) DEVI 1 kit by Does not apply route once a week. Check Blood Pressure regularly and record readings into the Bab[employer] App.  Large Cuff.  DX O90.0 (Patient taking differently: 1 kit by Does not apply route once a week. Check Blood Pressure regularly and record readings into the Babyscripts App.  Large Cuff.  DX O90.0) 1 each 0   cariprazine (VRAYLAR) capsule Take by mouth.     cetirizine (ZYRTEC) 10 MG tablet Take 1 tablet (10 mg total) by mouth daily. 90 tablet 4   Chlorzoxazone 750 MG TABS      clonazePAM (KLONOPIN) 0.5 MG tablet Take 0.5 mg by mouth 2 (two) times daily as needed for anxiety.     clotrimazole-betamethasone (LOTRISONE) cream APPLY TO AFFECTED AREA TWICE A DAY 45 g 3   diclofenac Sodium (VOLTAREN) 1 % GEL Apply topically.     EPINEPHrine 0.3 mg/0.3 mL IJ SOAJ injection Inject into the muscle once.     Erenumab-aooe (AIMOVIG) 70 MG/ML SOAJ Inject 140 mg into the skin every 30 (thirty) days.     ferrous sulfate 325 (65 FE) MG tablet Take 325 mg by mouth every other day.     fluticasone (FLONASE) 50 MCG/ACT nasal spray Place 2 sprays into both nostrils daily as needed for allergies  or rhinitis.      ibuprofen (ADVIL) 600 MG tablet Take 1 tablet (600 mg total) by mouth every 6 (six) hours as needed. 30 tablet 1   labetalol (NORMODYNE) 100 MG tablet Take 1 tablet (100 mg total) by mouth 2 (two) times daily. 60 tablet 2   megestrol (MEGACE) 40 MG tablet Take 1 tablet (40 mg total) by mouth 2 (two) times daily. 60 tablet 1   NYSTATIN PO Take by  mouth.     oxyCODONE-acetaminophen (PERCOCET/ROXICET) 5-325 MG tablet Take 1 tablet by mouth every 6 (six) hours as needed for severe pain. 5 tablet 0   prazosin (MINIPRESS) 1 MG capsule Take 1 mg by mouth at bedtime.     prazosin (MINIPRESS) 5 MG capsule Take 5 mg by mouth at bedtime.     pregabalin (LYRICA) 150 MG capsule Take by mouth.     Prenatal Vit-Fe Fumarate-FA (MULTIVITAMIN-PRENATAL) 27-0.8 MG TABS tablet Take 1 tablet by mouth daily at 12 noon.     sertraline (ZOLOFT) 100 MG tablet Take 150 mg by mouth daily.     temazepam (RESTORIL) 15 MG capsule Take 15 mg by mouth at bedtime as needed.     terbinafine (LAMISIL) 250 MG tablet Take 1 tablet (250 mg total) by mouth daily. 90 tablet 0   tinidazole (TINDAMAX) 250 MG tablet Take by mouth daily with breakfast.     tizanidine (ZANAFLEX) 6 MG capsule TAKE ONE CAPSULE BY MOUTH AS NEEDED FOR HEADACHE EVERY 8 HOURS     tranexamic acid (LYSTEDA) 650 MG TABS tablet Take 2 tablets (1,300 mg total) by mouth 3 (three) times daily. Take during menses for a maximum of five days 30 tablet 5   UBRELVY 100 MG TABS PLEASE SEE ATTACHED FOR DETAILED DIRECTIONS     No current facility-administered medications for this visit.    PHYSICAL EXAM: Vitals:   10/09/23 1002  BP: 136/80  Pulse: 66  Resp: 20  SpO2: 96%  Weight: 242 lb 9.6 oz (110 kg)  Height: 5\' 3"  (1.6 m)   Body mass index is 42.97 kg/m.  Wt Readings from Last 3 Encounters:  10/09/23 242 lb 9.6 oz (110 kg)  07/09/22 244 lb (110.7 kg)  06/18/22 248 lb (112.5 kg)     General: Well developed, well nourished female in no  apparent distress.  HEENT: AT/[place], no external lesions. Hearing intact to the spoken word Eyes: Conjunctiva clear and no icterus. Neck: Trachea midline, neck supple without appreciable thyromegaly or lymphadenopathy and no palpable thyroid nodules Lungs: Clear to auscultation, no wheeze. Respirations not labored Heart: S1S2, Regular in rate and rhythm.  Abdomen: Soft, non tender Neurologic: Alert, oriented, normal speech, deep tendon biceps reflexes normal,  no gross focal neurological deficit Extremities: No pedal pitting edema, no tremors of outstretched hands Skin: Warm, color good.  Psychiatric: Does not appear depressed or anxious  PERTINENT HISTORIC LABORATORY AND IMAGING STUDIES:  All pertinent laboratory results were reviewed. Please see HPI also for further details.   TSH  Date Value Ref Range Status  12/26/2017 1.750 0.450 - 4.500 uIU/mL Final     ASSESSMENT / PLAN  1. Elevated TSH    -Patient is referred for evaluation of elevated TSH.  No lab results available to review.  She used to have normal thyroid function test with normal TSH as of May 2021 with TSH of 3.370.  Patient has no overt hypothyroid symptoms /signs.  She has fatigue however can be related with multiple factors.  Plan : -Check thyroid function test TSH, free T4 -I would like to check thyroid autoantibodies including thyroid peroxidase antibody and thyroglobulin antibody to look for autoimmune thyroid disorder. -No indication of ultrasound thyroid at this time.  If she has elevated TSH we will start on thyroid hormone replacement/levothyroxine.   We discussed the medical need for compliance with levothyroxine therapy, that it is a hormone necessary for life, and that serious consequences may result from noncompliance. Discussed  the proper method of levothyroxine administration: take on an empty stomach in the morning, with water, waiting thirty to sixty minutes before taking any other beverages or food.  Also reviewed the need to take calcium or iron supplements or multivitamin (that may contain iron or calcium) at least 4 hours after levothyroxine administration.  Diagnoses and all orders for this visit:  Elevated TSH -     T4, free; Future -     TSH; Future -     Thyroid peroxidase antibody; Future -     Thyroglobulin Level; Future -     Thyroglobulin Level -     Thyroid peroxidase antibody -     TSH -     T4, free   Labs reviewed normal thyroid function test.  Mildly elevated thyroid peroxidase antibody, consistent with having Hashimoto thyroiditis however has not cause hypothyroid yet.  This can ultimately cause hypothyroidism over time.  Will need to monitor thyroid function test regularly.  I would like to check TSH, free T4 in 6 months and have her follow-up with me in 6 months.  Prefer to do lab 1 to 2 days prior to follow-up visit.   Latest Reference Range & Units 10/09/23 10:42  TSH 0.35 - 5.50 uIU/mL 5.03  T4,Free(Direct) 0.60 - 1.60 ng/dL 4.09  Thyroglobulin ng/mL 7.4  Thyroperoxidase Ab SerPl-aCnc <9 IU/mL 280 (H)  (H): Data is abnormally high  DISPOSITION Follow up in clinic in to be determined based on the above plan.  All questions answered and patient verbalized understanding of the plan.  [place] Elgin Carn, MD [facility] Endocrinology [facility] [address] [place], [address] [place], [place] [address] Phone # [phone]  At least part of this note was generated using voice recognition software. Inadvertent word errors may have occurred, which were not recognized during the proofreading process.

## 2023-10-10 LAB — THYROGLOBULIN LEVEL: Thyroglobulin: 7.4 ng/mL

## 2023-10-10 LAB — THYROID PEROXIDASE ANTIBODY: Thyroperoxidase Ab SerPl-aCnc: 280 [IU]/mL — ABNORMAL HIGH (ref <9)

## 2023-10-11 ENCOUNTER — Telehealth: Payer: Self-pay

## 2023-10-11 NOTE — Telephone Encounter (Signed)
Results given to patient. No questions at this time. Patient was offered to be transferred to front desk to schedule lab appointment , however stated she would call back at a later date to schedule.

## 2023-10-11 NOTE — Telephone Encounter (Signed)
-----   Message from Sheri Roberts sent at 10/11/2023 11:48 AM EDT ----- Please notify patient of Labs reviewed normal thyroid function test.  Mildly elevated thyroid peroxidase antibody, consistent with having Hashimoto thyroiditis however has not cause hypothyroid yet.  This can ultimately cause hypothyroidism over time.  Will need to monitor thyroid function test regularly.  No need of thyroid medication at this time.  I would like to check TSH, free T4 in 6 months and have her follow-up with me in 6 months.  Prefer to do lab 1 to 2 days prior to follow-up visit.  I have placed order.  Please arrange for visits.

## 2023-10-11 NOTE — Addendum Note (Signed)
Addended by: Ladye Macnaughton, [place] on: 10/11/2023 11:47 AM   Modules accepted: Orders

## 2023-12-21 ENCOUNTER — Other Ambulatory Visit: Payer: Self-pay | Admitting: Podiatry

## 2024-03-18 ENCOUNTER — Ambulatory Visit: Admitting: Podiatrist

## 2024-03-18 ENCOUNTER — Encounter: Payer: Self-pay | Admitting: Podiatrist

## 2024-03-18 VITALS — Ht 63.0 in | Wt 242.6 lb

## 2024-03-18 DIAGNOSIS — L0889 Other specified local infections of the skin and subcutaneous tissue: Secondary | ICD-10-CM

## 2024-03-18 DIAGNOSIS — B353 Tinea pedis: Secondary | ICD-10-CM

## 2024-03-18 MED ORDER — KETOCONAZOLE 2 % EX CREA: TOPICAL_CREAM | CUTANEOUS | 4 refills | Status: AC

## 2024-03-18 MED ORDER — TERBINAFINE HCL 250 MG PO TABS: 250.0000 mg | ORAL_TABLET | Freq: Every day | ORAL | 0 refills | Status: AC

## 2024-03-18 NOTE — Progress Notes (Signed)
 Chief Complaint  Patient presents with   Foot Pain    " My left foot has a sore between the 4th and 5th toe, and my right foot has a spot in the middle that it itching"     HPI: Patient is 48 y.o. female who presents today for the above mentioned concerns.    Allergies  Allergen Reactions   Bee Venom Swelling   Penicillins Hives   Sulfa Antibiotics Hives    Review of systems is negative except as noted in the HPI.  Denies nausea/ vomiting/ fevers/ chills or night sweats.   Denies difficulty breathing, denies calf pain or tenderness  Physical Exam  Patient is awake, alert, and oriented x 3.  In no acute distress.    Vascular status is intact with palpable pedal pulses DP and PT bilateral and capillary refill time less than 3 seconds bilateral.  No edema or erythema noted.   Neurological exam reveals epicritic and protective sensation grossly intact bilateral.   Dermatological exam reveals skin is supple and dry to bilateral feet.  Peeling between toes 4,5 of the right and left foot noted.  Small macule present plantar right foot consistent with tinea pedis.   Musculoskeletal exam: Musculature intact with dorsiflexion, plantarflexion, inversion, eversion. Ankle and First MPJ joint range of motion normal.    Assessment:   ICD-10-CM   1. Tinea pedis of both feet  B35.3        Plan: Discussed treatment options and alternatives.  Wrote her another 30-day prescription for terbinafine.  Also applied Castellani's paint in between the fourth and fifth toes and advised her on its use.  Prescription for ketoconazole was also written for her use as well.  She will call in the future as needed to be seen.
